# Patient Record
Sex: Female | Born: 1990 | Race: Black or African American | Hispanic: No | Marital: Single | Smoking: Current every day smoker
Health system: Southern US, Community
[De-identification: ages and names within clinical notes are randomized; demographics above are authoritative.]

## PROBLEM LIST (undated history)

## (undated) DIAGNOSIS — R011 Cardiac murmur, unspecified: Secondary | ICD-10-CM

## (undated) DIAGNOSIS — D649 Anemia, unspecified: Secondary | ICD-10-CM

## (undated) HISTORY — PX: TONSILLECTOMY: SUR1361

---
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
## 2008-06-03 ENCOUNTER — Emergency Department: Payer: Self-pay | Admitting: Internal Medicine

## 2008-12-15 ENCOUNTER — Emergency Department: Payer: Self-pay | Admitting: Emergency Medicine

## 2008-12-17 ENCOUNTER — Emergency Department: Payer: Self-pay | Admitting: Emergency Medicine

## 2009-05-04 ENCOUNTER — Ambulatory Visit: Payer: Self-pay | Admitting: Internal Medicine

## 2009-05-24 ENCOUNTER — Inpatient Hospital Stay: Payer: Self-pay | Admitting: Internal Medicine

## 2009-05-27 ENCOUNTER — Ambulatory Visit: Payer: Self-pay | Admitting: Internal Medicine

## 2009-06-03 ENCOUNTER — Ambulatory Visit: Payer: Self-pay | Admitting: Internal Medicine

## 2009-07-19 ENCOUNTER — Emergency Department: Payer: Self-pay | Admitting: Emergency Medicine

## 2010-01-19 ENCOUNTER — Emergency Department: Payer: Self-pay | Admitting: Emergency Medicine

## 2011-06-06 ENCOUNTER — Emergency Department: Payer: Self-pay | Admitting: *Deleted

## 2011-08-15 ENCOUNTER — Emergency Department: Payer: Self-pay | Admitting: Unknown Physician Specialty

## 2012-09-14 ENCOUNTER — Emergency Department: Payer: Self-pay | Admitting: Internal Medicine

## 2012-09-21 ENCOUNTER — Emergency Department: Payer: Self-pay | Admitting: Emergency Medicine

## 2012-09-22 ENCOUNTER — Emergency Department: Payer: Self-pay | Admitting: Unknown Physician Specialty

## 2013-10-02 ENCOUNTER — Emergency Department: Payer: Self-pay | Admitting: Emergency Medicine

## 2013-10-02 LAB — URINALYSIS, COMPLETE
BACTERIA: NONE SEEN
Bilirubin,UR: NEGATIVE
Blood: NEGATIVE
Glucose,UR: NEGATIVE mg/dL (ref 0–75)
Ketone: NEGATIVE
NITRITE: NEGATIVE
PROTEIN: NEGATIVE
Ph: 5 (ref 4.5–8.0)
Specific Gravity: 1.021 (ref 1.003–1.030)
WBC UR: 5 /HPF (ref 0–5)

## 2013-10-02 LAB — GC/CHLAMYDIA PROBE AMP

## 2013-10-02 LAB — WET PREP, GENITAL

## 2013-10-02 LAB — HCG, QUANTITATIVE, PREGNANCY

## 2015-05-08 ENCOUNTER — Emergency Department
Admission: EM | Admit: 2015-05-08 | Discharge: 2015-05-08 | Disposition: A | Discharge status: Home / Self Care | Payer: Self-pay | Attending: Emergency Medicine | Admitting: Emergency Medicine

## 2015-05-08 ENCOUNTER — Encounter: Payer: Self-pay | Admitting: Emergency Medicine

## 2015-05-08 DIAGNOSIS — L02411 Cutaneous abscess of right axilla: Secondary | ICD-10-CM | POA: Insufficient documentation

## 2015-05-08 DIAGNOSIS — L723 Sebaceous cyst: Secondary | ICD-10-CM | POA: Insufficient documentation

## 2015-05-08 DIAGNOSIS — L089 Local infection of the skin and subcutaneous tissue, unspecified: Secondary | ICD-10-CM

## 2015-05-08 DIAGNOSIS — Z720 Tobacco use: Secondary | ICD-10-CM | POA: Insufficient documentation

## 2015-05-08 HISTORY — DX: Anemia, unspecified: D64.9

## 2015-05-08 HISTORY — DX: Cardiac murmur, unspecified: R01.1

## 2015-05-08 MED ORDER — TRAMADOL HCL 50 MG PO TABS: 50.0000 mg | ORAL_TABLET | Freq: Two times a day (BID) | ORAL | Status: DC

## 2015-05-08 MED ORDER — LIDOCAINE-EPINEPHRINE (PF) 1 %-1:200000 IJ SOLN
30.0000 mL | Freq: Once | INTRAMUSCULAR | Status: AC
  Administered 2015-05-08: 30 mL via INTRADERMAL
  Filled 2015-05-08: qty 30

## 2015-05-08 MED ORDER — SULFAMETHOXAZOLE-TRIMETHOPRIM 800-160 MG PO TABS: 2.0000 | ORAL_TABLET | Freq: Two times a day (BID) | ORAL | Status: DC

## 2015-05-08 NOTE — ED Notes (Signed)
Noticed a large raised area under right arm about 3 days ago.. Area is tender but not red  No drainage noted

## 2015-05-08 NOTE — ED Provider Notes (Signed)
[facility] Emergency Department Provider Note ?____________________________________________ ? Time seen: 1212 ? I have reviewed the triage vital signs and the nursing notes. ________ HISTORY ? Chief Complaint Lymphadenopathy  HPI  Courtney Gordon is a 24 y.o. female reports to the ED for evaluation of a tender raised area to the right armpit for about 3 days. She describes putting warm compresses on the area to help and that seems to have caused increased swelling there. She denies any spontaneous drainage, fever, chills, or sweats. She denies any prior history of abscesses or boils.    Past Medical History  Diagnosis Date  . Heart murmur   . Anemia    There are no active problems to display for this patient. ? Past Surgical History  Procedure Laterality Date  . Tonsillectomy     Current Outpatient Rx  Name  Route  Sig  Dispense  Refill  . sulfamethoxazole-trimethoprim (BACTRIM DS,SEPTRA DS) 800-160 MG per tablet   Oral   Take 2 tablets by mouth 2 (two) times daily.   40 tablet   0   . traMADol (ULTRAM) 50 MG tablet   Oral   Take 1 tablet (50 mg total) by mouth 2 (two) times daily.   10 tablet   0   ? Allergies Review of patient's allergies indicates no known allergies. ? No family history on file. ? Social History Social History  Substance Use Topics  . Smoking status: Current Every Day Smoker    Types: Cigarettes  . Smokeless tobacco: None  . Alcohol Use: No    Review of Systems Constitutional: Negative for fever. HEENT:  Normocephalic/atraumatic. Negative for visual/hearingchanges, sore throat, or nasal congestion. Cardiovascular: Negative for chest pain. Respiratory: Negative for shortness of breath. Musculoskeletal: Negative for back pain. Skin: Tender area to right axilla as above. Neurological: Negative for headaches, focal weakness or numbness. Hematological/Lymphatic:Negative for enlarged lymph nodes  10-point ROS  otherwise negative. ____________________________________________  PHYSICAL EXAM:  VITAL SIGNS: ED Triage Vitals  Enc Vitals Group     BP 05/08/15 1127 117/72 mmHg     Pulse Rate 05/08/15 1127 85     Resp 05/08/15 1127 16     Temp 05/08/15 1127 98.1 F (36.7 C)     Temp Source 05/08/15 1127 Oral     SpO2 05/08/15 1127 100 %     Weight 05/08/15 1127 130 lb (58.968 kg)     Height 05/08/15 1127 5\' 5"  (1.651 m)     Head Cir --      Peak Flow --      Pain Score 05/08/15 1128 8     Pain Loc --      Pain Edu? --      Excl. in [place]? --    Constitutional: Alert and oriented. Well appearing and in no distress. HEENT: Normocephalic and atraumatic.Conjunctivae are normal. PERRL. Normal extraocular movements. Mucous membranes are moist. Hematological/Lymphatic/Immunological: No cervical lymphadenopathy. Cardiovascular: Normal rate, regular rhythm.No murmurs, rubs, or gallops.  Respiratory: Normal respiratory effort. Breath sounds are clear and equal bilaterally. No wheezes/rales/rhonchi. Gastrointestinal: Soft and non tender. No distention.  Genitourinary: deferred Musculoskeletal: Nontender with normal range of motion in all extremities. No joint effusions.  No lower extremity tenderness nor edema. Neurologic:  Normal speech and language. No gross focal neurologic deficits are appreciated.  Skin:  Right axilla with firm, tender, cystic formation.  Psychiatric: Mood and affect are normal. Speech and behavior are normal. Patient exhibits appropriate insight and judgment. _______________________ INCISION  AND DRAINAGE  Performed by: Melvenia Needles Consent: Verbal consent obtained. Risks and benefits: risks, benefits and alternatives were discussed Type: abscess  Body area: right axilla  Anesthesia: local infiltration  Incision was made with a scalpel.  Local anesthetic: lidocaine 1% w/epinephrine  Anesthetic total: 8 ml  Complexity: complex Blunt dissection to break up  loculations  Drainage: purulent & sebaceous   Drainage amount: 2 ml   Packing material: 1/4 in iodoform gauze  Patient tolerance: Patient tolerated the procedure well with no immediate complications. ______________________________________________________ INITIAL IMPRESSION / ASSESSMENT AND PLAN / ED COURSE ? Acutely infected sebaceous cyst s/p I & D. Wound packing instilled. Patient given instructions for wound care and shaving hygiene. Return to the ED in 3 days for wound check. Bactrim DS and Ultram #10 provided.  ____________________________________________ FINAL CLINICAL IMPRESSION(S) / ED DIAGNOSES?  Final diagnoses:  Infected sebaceous cyst  Abscess of axilla, right      Melvenia Needles, PA-C 05/08/15 1334  Daymon Larsen, MD 05/08/15 1430

## 2015-05-08 NOTE — Discharge Instructions (Signed)
Abscess An abscess (boil or furuncle) is an infected area on or under the skin. This area is filled with yellowish-white fluid (pus) and other material (debris). HOME CARE   Only take medicines as told by your doctor.  If you were given antibiotic medicine, take it as directed. Finish the medicine even if you start to feel better.  If gauze is used, follow your doctor's directions for changing the gauze.  To avoid spreading the infection:  Keep your abscess covered with a bandage.  Wash your hands well.  Do not share personal care items, towels, or whirlpools with others.  Avoid skin contact with others.  Keep your skin and clothes clean around the abscess.  Keep all doctor visits as told. GET HELP RIGHT AWAY IF:   You have more pain, puffiness (swelling), or redness in the wound site.  You have more fluid or blood coming from the wound site.  You have muscle aches, chills, or you feel sick.  You have a fever. MAKE SURE YOU:   Understand these instructions.  Will watch your condition.  Will get help right away if you are not doing well or get worse. Document Released: 02/06/2008 Document Revised: 02/19/2012 Document Reviewed: 11/02/2011 [facility] Patient Information 2015 [place], [place]. This information is not intended to replace advice given to you by your health care provider. Make sure you discuss any questions you have with your health care provider.  Incision and Drainage Incision and drainage is a procedure in which a sac-like structure (cystic structure) is opened and drained. The area to be drained usually contains material such as pus, fluid, or blood.  LET YOUR CAREGIVER KNOW ABOUT:   Allergies to medicine.  Medicines taken, including vitamins, herbs, eyedrops, over-the-counter medicines, and creams.  Use of steroids (by mouth or creams).  Previous problems with anesthetics or numbing medicines.  History of bleeding problems or blood clots.  Previous  surgery.  Other health problems, including diabetes and kidney problems.  Possibility of pregnancy, if this applies. RISKS AND COMPLICATIONS  Pain.  Bleeding.  Scarring.  Infection. BEFORE THE PROCEDURE  You may need to have an ultrasound or other imaging tests to see how large or deep your cystic structure is. Blood tests may also be used to determine if you have an infection or how severe the infection is. You may need to have a tetanus shot. PROCEDURE  The affected area is cleaned with a cleaning fluid. The cyst area will then be numbed with a medicine (local anesthetic). A small incision will be made in the cystic structure. A syringe or catheter may be used to drain the contents of the cystic structure, or the contents may be squeezed out. The area will then be flushed with a cleansing solution. After cleansing the area, it is often gently packed with a gauze or another wound dressing. Once it is packed, it will be covered with gauze and tape or some other type of wound dressing. AFTER THE PROCEDURE   Often, you will be allowed to go home right after the procedure.  You may be given antibiotic medicine to prevent or heal an infection.  If the area was packed with gauze or some other wound dressing, you will likely need to come back in 1 to 2 days to get it removed.  The area should heal in about 14 days. Document Released: 02/13/2001 Document Revised: 02/19/2012 Document Reviewed: 10/15/2011 [facility] Patient Information 2015 [place], [place]. This information is not intended to replace advice  given to you by your health care provider. Make sure you discuss any questions you have with your health care provider.  Keep the wound clean, dry, and covered. Return in 3 days for wound check. Take the antibiotic as directed.

## 2015-05-08 NOTE — ED Notes (Signed)
Pt reports lump under in right axillary area. Area is warm to touch, pt reports its been there for 3 days.

## 2015-05-11 ENCOUNTER — Emergency Department
Admission: EM | Admit: 2015-05-11 | Discharge: 2015-05-11 | Disposition: A | Discharge status: Home / Self Care | Payer: Self-pay | Attending: Emergency Medicine | Admitting: Emergency Medicine

## 2015-05-11 DIAGNOSIS — Z720 Tobacco use: Secondary | ICD-10-CM | POA: Insufficient documentation

## 2015-05-11 DIAGNOSIS — Z79899 Other long term (current) drug therapy: Secondary | ICD-10-CM | POA: Insufficient documentation

## 2015-05-11 DIAGNOSIS — Z4801 Encounter for change or removal of surgical wound dressing: Secondary | ICD-10-CM | POA: Insufficient documentation

## 2015-05-11 DIAGNOSIS — Z5189 Encounter for other specified aftercare: Secondary | ICD-10-CM

## 2015-05-11 NOTE — ED Notes (Signed)
Had abscess drained Sunday and here to having packing removed.

## 2015-05-11 NOTE — ED Provider Notes (Signed)
[facility] Emergency Department Provider Note ____________________________________________  Time seen: Approximately 10:19 PM  I have reviewed the triage vital signs and the nursing notes.   HISTORY  Chief Complaint Abscess   HPI Courtney Gordon is a 24 y.o. female who presents to the emergency department for packing removal. She was here 3 days ago and had an I&D of the right axilla. She reports the area is much smaller but remains tender. She is taking her antibiotics as prescribed.   Past Medical History  Diagnosis Date  . Heart murmur   . Anemia     There are no active problems to display for this patient.   Past Surgical History  Procedure Laterality Date  . Tonsillectomy      Current Outpatient Rx  Name  Route  Sig  Dispense  Refill  . sulfamethoxazole-trimethoprim (BACTRIM DS,SEPTRA DS) 800-160 MG per tablet   Oral   Take 2 tablets by mouth 2 (two) times daily.   40 tablet   0   . traMADol (ULTRAM) 50 MG tablet   Oral   Take 1 tablet (50 mg total) by mouth 2 (two) times daily.   10 tablet   0     Allergies Review of patient's allergies indicates no known allergies.  No family history on file.  Social History Social History  Substance Use Topics  . Smoking status: Current Every Day Smoker    Types: Cigarettes  . Smokeless tobacco: Not on file  . Alcohol Use: No    Review of Systems Constitutional: No fever/chills Eyes: No visual changes. ENT: No sore throat. Cardiovascular: Denies chest pain. Respiratory: Denies shortness of breath. Gastrointestinal: No abdominal pain.  No nausea, no vomiting.  Musculoskeletal: Negative for back pain. Skin: Packing of abscess right axilla. Neurological: Negative for headaches, focal weakness or numbness. 10-point ROS otherwise negative.  ____________________________________________   PHYSICAL EXAM:  VITAL SIGNS: ED Triage Vitals  Enc Vitals Group     BP 05/11/15 2039  91/63 mmHg     Pulse Rate 05/11/15 2039 81     Resp 05/11/15 2039 18     Temp 05/11/15 2039 98.5 F (36.9 C)     Temp Source 05/11/15 2039 Oral     SpO2 05/11/15 2039 98 %     Weight 05/11/15 2039 135 lb (61.236 kg)     Height 05/11/15 2039 5\' 5"  (1.651 m)     Head Cir --      Peak Flow --      Pain Score 05/11/15 2040 7     Pain Loc --      Pain Edu? --      Excl. in [place]? --     Constitutional: Alert and oriented. Well appearing and in no acute distress. Eyes: Conjunctivae are normal. PERRL. EOMI. Head: Atraumatic. Nose: No congestion/rhinnorhea. Mouth/Throat: Mucous membranes are moist.  Neck: No stridor. Cardiovascular:Good peripheral circulation. Respiratory: Normal respiratory effort.  No retractions.  Musculoskeletal: Full ROM throughout. Neurologic:  Normal speech and language. No gross focal neurologic deficits are appreciated. Speech is normal. No gait instability. Skin: Skin drainage noted on the dressing. Site of I&D appears to be resolving.   Psychiatric: Mood and affect are normal. Speech and behavior are normal.  ____________________________________________   LABS (all labs ordered are listed, but only abnormal results are displayed)  Labs Reviewed - No data to display ____________________________________________  RADIOLOGY  Not indicated ____________________________________________   PROCEDURES  Procedure(s) performed: Packing removed from right  axilla  ___________________________________________   INITIAL IMPRESSION / ASSESSMENT AND PLAN / ED COURSE  Pertinent labs & imaging results that were available during my care of the patient were reviewed by me and considered in my medical decision making (see chart for details).  Patient was advised to follow up with the primary care provider for symptoms of concern or return to the emergency department if unable to schedule an appointment. ____________________________________________   FINAL CLINICAL  IMPRESSION(S) / ED DIAGNOSES  Final diagnoses:  Wound check, abscess          Victorino Dike, FNP 05/11/15 2222  Harvest Dark, MD 05/11/15 2227

## 2015-08-07 ENCOUNTER — Encounter: Payer: Self-pay | Admitting: Emergency Medicine

## 2015-08-07 ENCOUNTER — Emergency Department
Admission: EM | Admit: 2015-08-07 | Discharge: 2015-08-07 | Disposition: A | Discharge status: Home / Self Care | Payer: Self-pay | Attending: Emergency Medicine | Admitting: Emergency Medicine

## 2015-08-07 DIAGNOSIS — F17210 Nicotine dependence, cigarettes, uncomplicated: Secondary | ICD-10-CM | POA: Insufficient documentation

## 2015-08-07 DIAGNOSIS — Z3201 Encounter for pregnancy test, result positive: Secondary | ICD-10-CM | POA: Insufficient documentation

## 2015-08-07 DIAGNOSIS — O99331 Smoking (tobacco) complicating pregnancy, first trimester: Secondary | ICD-10-CM | POA: Insufficient documentation

## 2015-08-07 DIAGNOSIS — Z3A01 Less than 8 weeks gestation of pregnancy: Secondary | ICD-10-CM | POA: Insufficient documentation

## 2015-08-07 DIAGNOSIS — Z792 Long term (current) use of antibiotics: Secondary | ICD-10-CM | POA: Insufficient documentation

## 2015-08-07 DIAGNOSIS — Z79899 Other long term (current) drug therapy: Secondary | ICD-10-CM | POA: Insufficient documentation

## 2015-08-07 DIAGNOSIS — O210 Mild hyperemesis gravidarum: Secondary | ICD-10-CM | POA: Insufficient documentation

## 2015-08-07 LAB — POCT PREGNANCY, URINE: Preg Test, Ur: POSITIVE — AB

## 2015-08-07 NOTE — ED Provider Notes (Signed)
[facility] Emergency Department Provider Note  ____________________________________________  Time seen: Approximately 1:10 PM  I have reviewed the triage vital signs and the nursing notes.   HISTORY  Chief Complaint Possible Pregnancy    HPI Courtney Gordon is a 24 y.o. female who presents to the emergency department complaining of morning nausea and vomiting. She states that she "feels like I am pregnant." She states that she has been using home pregnancy tests that have returned either as invalid or negative. She states that her last period was October 2. Patient denies any vaginal discharge or bleeding. Patient denies any abdominal pain. No other symptoms or complaints.   Past Medical History  Diagnosis Date  . Heart murmur   . Anemia     There are no active problems to display for this patient.   Past Surgical History  Procedure Laterality Date  . Tonsillectomy      Current Outpatient Rx  Name  Route  Sig  Dispense  Refill  . sulfamethoxazole-trimethoprim (BACTRIM DS,SEPTRA DS) 800-160 MG per tablet   Oral   Take 2 tablets by mouth 2 (two) times daily.   40 tablet   0   . traMADol (ULTRAM) 50 MG tablet   Oral   Take 1 tablet (50 mg total) by mouth 2 (two) times daily.   10 tablet   0     Allergies Review of patient's allergies indicates no known allergies.  History reviewed. No pertinent family history.  Social History Social History  Substance Use Topics  . Smoking status: Current Every Day Smoker    Types: Cigarettes  . Smokeless tobacco: None  . Alcohol Use: No    Review of Systems Constitutional: No fever/chills Eyes: No visual changes. ENT: No sore throat. Cardiovascular: Denies chest pain. Respiratory: Denies shortness of breath. Gastrointestinal: No abdominal pain.  Endorses morning nausea and vomiting.  No diarrhea.  No constipation. Genitourinary: Negative for dysuria. Musculoskeletal: Negative for back  pain. Skin: Negative for rash. Neurological: Negative for headaches, focal weakness or numbness.  10-point ROS otherwise negative.  ____________________________________________   PHYSICAL EXAM:  VITAL SIGNS: ED Triage Vitals  Enc Vitals Group     BP 08/07/15 1223 99/51 mmHg     Pulse Rate 08/07/15 1223 72     Resp 08/07/15 1223 18     Temp 08/07/15 1223 98.8 F (37.1 C)     Temp Source 08/07/15 1223 Oral     SpO2 08/07/15 1223 98 %     Weight 08/07/15 1223 127 lb (57.607 kg)     Height 08/07/15 1223 5\' 6"  (1.676 m)     Head Cir --      Peak Flow --      Pain Score --      Pain Loc --      Pain Edu? --      Excl. in [place]? --     Constitutional: Alert and oriented. Well appearing and in no acute distress. Eyes: Conjunctivae are normal. PERRL. EOMI. Head: Atraumatic. Nose: No congestion/rhinnorhea. Mouth/Throat: Mucous membranes are moist.  Oropharynx non-erythematous. Neck: No stridor.   Cardiovascular: Normal rate, regular rhythm. Grossly normal heart sounds.  Good peripheral circulation. Respiratory: Normal respiratory effort.  No retractions. Lungs CTAB. Gastrointestinal: Soft and nontender. No distention. No abdominal bruits. No CVA tenderness. Musculoskeletal: No lower extremity tenderness nor edema.  No joint effusions. Neurologic:  Normal speech and language. No gross focal neurologic deficits are appreciated. No gait instability. Skin:  Skin is warm, dry and intact. No rash noted. Psychiatric: Mood and affect are normal. Speech and behavior are normal.  ____________________________________________   LABS (all labs ordered are listed, but only abnormal results are displayed)  Labs Reviewed  POCT PREGNANCY, URINE - Abnormal; Notable for the following:    Preg Test, Ur POSITIVE (*)    All other components within normal limits  POC URINE PREG, ED    ____________________________________________  EKG   ____________________________________________  RADIOLOGY   ____________________________________________   PROCEDURES  Procedure(s) performed: None  Critical Care performed: No  ____________________________________________   INITIAL IMPRESSION / ASSESSMENT AND PLAN / ED COURSE  Pertinent labs & imaging results that were available during my care of the patient were reviewed by me and considered in my medical decision making (see chart for details).  Patient's history, symptoms, physical exam are taken and the consideration for diagnosis. I advised patient of findings and diagnosis and the patient verbalizes understanding of same. The patient is pregnant by pregnancy test here in the emergency department. Patient is to take over-the-counter prenatal vitamins and scheduled OB/GYN appointment. I explained the treatment plan to the patient and the patient verbalizes understanding and compliance with same. Patient is to follow-up with primary care provider or specialist provided on paperwork for further evaluation and treatment should symptoms persist past treatment course. ED precautions are given to return for worsening of symptoms.  All of the patient's questions are answered. ____________________________________________   FINAL CLINICAL IMPRESSION(S) / ED DIAGNOSES  Final diagnoses:  Encounter for pregnancy test, result positive      Darletta Moll, PA-C 08/07/15 1326  Ahmed Prima, MD 08/07/15 [id number]

## 2015-08-07 NOTE — Discharge Instructions (Signed)
Prenatal Care °WHAT IS PRENATAL CARE?  °Prenatal care is the process of caring for a pregnant woman before she gives birth. Prenatal care makes sure that she and her baby remain as healthy as possible throughout pregnancy. Prenatal care may be provided by a midwife, family practice health care provider, or a childbirth and pregnancy specialist (obstetrician). Prenatal care may include physical examinations, testing, treatments, and education on nutrition, lifestyle, and social support services. °WHY IS PRENATAL CARE SO IMPORTANT?  °Early and consistent prenatal care increases the chance that you and your baby will remain healthy throughout your pregnancy. This type of care also decreases a baby's risk of being born too early (prematurely), or being born smaller than expected (small for gestational age). Any underlying medical conditions you may have that could pose a risk during your pregnancy are discussed during prenatal care visits. You will also be monitored regularly for any new conditions that may arise during your pregnancy so they can be treated quickly and effectively. °WHAT HAPPENS DURING PRENATAL CARE VISITS? °Prenatal care visits may include the following: °Discussion °Tell your health care provider about any new signs or symptoms you have experienced since your last visit. These might include: °· Nausea or vomiting. °· Increased or decreased level of energy. °· Difficulty sleeping. °· Back or leg pain. °· Weight changes. °· Frequent urination. °· Shortness of breath with physical activity. °· Changes in your skin, such as the development of a rash or itchiness. °· Vaginal discharge or bleeding. °· Feelings of excitement or nervousness. °· Changes in your baby's movements. °You may want to write down any questions or topics you want to discuss with your health care provider and bring them with you to your appointment. °Examination °During your first prenatal care visit, you will likely have a complete  physical exam. Your health care provider will often examine your vagina, cervix, and the position of your uterus, as well as check your heart, lungs, and other body systems. As your pregnancy progresses, your health care provider will measure the size of your uterus and your baby's position inside your uterus. He or she may also examine you for early signs of labor. Your prenatal visits may also include checking your blood pressure and, after about 10-12 weeks of pregnancy, listening to your baby's heartbeat. °Testing °Regular testing often includes: °· Urinalysis. This checks your urine for glucose, protein, or signs of infection. °· Blood count. This checks the levels of white and red blood cells in your body. °· Tests for sexually transmitted infections (STIs). Testing for STIs at the beginning of pregnancy is routinely done and is required in many states. °· Antibody testing. You will be checked to see if you are immune to certain illnesses, such as rubella, that can affect a developing fetus. °· Glucose screen. Around 24-28 weeks of pregnancy, your blood glucose level will be checked for signs of gestational diabetes. Follow-up tests may be recommended. °· Group B strep. This is a bacteria that is commonly found inside a woman's vagina. This test will inform your health care provider if you need an antibiotic to reduce the amount of this bacteria in your body prior to labor and childbirth. °· Ultrasound. Many pregnant women undergo an ultrasound screening around 18-20 weeks of pregnancy to evaluate the health of the fetus and check for any developmental abnormalities. °· HIV (human immunodeficiency virus) testing. Early in your pregnancy, you will be screened for HIV. If you are at high risk for HIV, this test   may be repeated during your third trimester of pregnancy. You may be offered other testing based on your age, personal or family medical history, or other factors.  HOW OFTEN SHOULD I PLAN TO SEE MY  HEALTH CARE PROVIDER FOR PRENATAL CARE? Your prenatal care check-up schedule depends on any medical conditions you have before, or develop during, your pregnancy. If you do not have any underlying medical conditions, you will likely be seen for checkups:  Monthly, during the first 6 months of pregnancy.  Twice a month during months 7 and 8 of pregnancy.  Weekly starting in the 9th month of pregnancy and until delivery. If you develop signs of early labor or other concerning signs or symptoms, you may need to see your health care provider more often. Ask your health care provider what prenatal care schedule is best for you. WHAT CAN I DO TO KEEP MYSELF AND MY BABY AS HEALTHY AS POSSIBLE DURING MY PREGNANCY?  Take a prenatal vitamin containing 400 micrograms (0.4 mg) of folic acid every day. Your health care provider may also ask you to take additional vitamins such as iodine, vitamin D, iron, copper, and zinc.  Take 1500-2000 mg of calcium daily starting at your 20th week of pregnancy until you deliver your baby.  Make sure you are up to date on your vaccinations. Unless directed otherwise by your health care provider:  You should receive a tetanus, diphtheria, and pertussis (Tdap) vaccination between the 27th and 36th week of your pregnancy, regardless of when your last Tdap immunization occurred. This helps protect your baby from whooping cough (pertussis) after he or she is born.  You should receive an annual inactivated influenza vaccine (IIV) to help protect you and your baby from influenza. This can be done at any point during your pregnancy.  Eat a well-rounded diet that includes:  Fresh fruits and vegetables.  Lean proteins.  Calcium-rich foods such as milk, yogurt, hard cheeses, and dark, leafy greens.  Whole grain breads.  Do noteat seafood high in mercury, including:  Swordfish.  Tilefish.  Shark.  King mackerel.  More than 6 oz tuna per week.  Do not eat:  Raw  or undercooked meats or eggs.  Unpasteurized foods, such as soft cheeses (brie, blue, or feta), juices, and milks.  Lunch meats.  Hot dogs that have not been heated until they are steaming.  Drink enough water to keep your urine clear or pale yellow. For many women, this may be 10 or more 8 oz glasses of water each day. Keeping yourself hydrated helps deliver nutrients to your baby and may prevent the start of pre-term uterine contractions.  Do not use any tobacco products including cigarettes, chewing tobacco, or electronic cigarettes. If you need help quitting, ask your health care provider.  Do not drink beverages containing alcohol. No safe level of alcohol consumption during pregnancy has been determined.  Do not use any illegal drugs. These can harm your developing baby or cause a miscarriage.  Ask your health care provider or pharmacist before taking any prescription or over-the-counter medicines, herbs, or supplements.  Limit your caffeine intake to no more than 200 mg per day.  Exercise. Unless told otherwise by your health care provider, try to get 30 minutes of moderate exercise most days of the week. Do not  do high-impact activities, contact sports, or activities with a high risk of falling, such as horseback riding or downhill skiing.  Get plenty of rest.  Avoid anything that raises your  body temperature, such as hot tubs and saunas. °· If you own a cat, do not empty its litter box. Bacteria contained in cat feces can cause an infection called toxoplasmosis. This can result in serious harm to the fetus. °· Stay away from chemicals such as insecticides, lead, mercury, and cleaning or paint products that contain solvents. °· Do not have any X-rays taken unless medically necessary. °· Take a childbirth and breastfeeding preparation class. Ask your health care provider if you need a referral or recommendation. °  °This information is not intended to replace advice given to you by  your health care provider. Make sure you discuss any questions you have with your health care provider. °  °Document Released: 08/23/2003 Document Revised: 09/10/2014 Document Reviewed: 11/04/2013 °Elsevier Interactive Patient Education ©2016 [employer] ° °

## 2015-08-07 NOTE — ED Notes (Signed)
States she has not had a period since Oct. Has taken 2 home preg test that were negative  But feels like she is preg. Denies any abd pain or vaginal bleeding

## 2015-08-07 NOTE — ED Notes (Signed)
Pt presents to ER for c/o of inconsistent menstrual cycle. Pt has not had cycle since November and is concerned if she is pregnant. States that she has taken 2 pregnancy tests that have been negative or invalid; would like confirmation. Pt has had nausea and vomiting urges everyday around 0500 .

## 2015-08-21 ENCOUNTER — Encounter: Payer: Self-pay | Admitting: Emergency Medicine

## 2015-08-21 ENCOUNTER — Emergency Department: Payer: Self-pay

## 2015-08-21 ENCOUNTER — Emergency Department
Admission: EM | Admit: 2015-08-21 | Discharge: 2015-08-21 | Disposition: A | Discharge status: Home / Self Care | Payer: Self-pay | Attending: Emergency Medicine | Admitting: Emergency Medicine

## 2015-08-21 DIAGNOSIS — Z87891 Personal history of nicotine dependence: Secondary | ICD-10-CM | POA: Insufficient documentation

## 2015-08-21 DIAGNOSIS — Z3A01 Less than 8 weeks gestation of pregnancy: Secondary | ICD-10-CM | POA: Insufficient documentation

## 2015-08-21 DIAGNOSIS — N760 Acute vaginitis: Secondary | ICD-10-CM

## 2015-08-21 DIAGNOSIS — O200 Threatened abortion: Secondary | ICD-10-CM

## 2015-08-21 DIAGNOSIS — N939 Abnormal uterine and vaginal bleeding, unspecified: Secondary | ICD-10-CM

## 2015-08-21 DIAGNOSIS — B9689 Other specified bacterial agents as the cause of diseases classified elsewhere: Secondary | ICD-10-CM

## 2015-08-21 DIAGNOSIS — O23591 Infection of other part of genital tract in pregnancy, first trimester: Secondary | ICD-10-CM | POA: Insufficient documentation

## 2015-08-21 DIAGNOSIS — O210 Mild hyperemesis gravidarum: Secondary | ICD-10-CM | POA: Insufficient documentation

## 2015-08-21 DIAGNOSIS — O98811 Other maternal infectious and parasitic diseases complicating pregnancy, first trimester: Secondary | ICD-10-CM | POA: Insufficient documentation

## 2015-08-21 DIAGNOSIS — A5901 Trichomonal vulvovaginitis: Secondary | ICD-10-CM

## 2015-08-21 LAB — WET PREP, GENITAL
Sperm: NEGATIVE
YEAST WET PREP: NEGATIVE — AB

## 2015-08-21 LAB — CBC WITH DIFFERENTIAL/PLATELET
BASOS PCT: 1 %
Basophils Absolute: 0.1 10*3/uL (ref 0–0.1)
EOS ABS: 0 10*3/uL (ref 0–0.7)
EOS PCT: 0 %
HCT: 34.9 % — ABNORMAL LOW (ref 35.0–47.0)
Hemoglobin: 11.1 g/dL — ABNORMAL LOW (ref 12.0–16.0)
LYMPHS ABS: 1.5 10*3/uL (ref 1.0–3.6)
Lymphocytes Relative: 15 %
MCH: 23.9 pg — AB (ref 26.0–34.0)
MCHC: 31.9 g/dL — AB (ref 32.0–36.0)
MCV: 75 fL — ABNORMAL LOW (ref 80.0–100.0)
MONOS PCT: 7 %
Monocytes Absolute: 0.8 10*3/uL (ref 0.2–0.9)
NEUTROS PCT: 77 %
Neutro Abs: 8 10*3/uL — ABNORMAL HIGH (ref 1.4–6.5)
PLATELETS: 248 10*3/uL (ref 150–440)
RBC: 4.66 MIL/uL (ref 3.80–5.20)
RDW: 19.4 % — ABNORMAL HIGH (ref 11.5–14.5)
WBC: 10.4 10*3/uL (ref 3.6–11.0)

## 2015-08-21 LAB — CHLAMYDIA/NGC RT PCR (ARMC ONLY)
CHLAMYDIA TR: NOT DETECTED
N GONORRHOEAE: NOT DETECTED

## 2015-08-21 LAB — HCG, QUANTITATIVE, PREGNANCY: hCG, Beta Chain, Quant, S: 46406 m[IU]/mL — ABNORMAL HIGH (ref <5)

## 2015-08-21 LAB — ABO/RH: ABO/RH(D): A POS

## 2015-08-21 MED ORDER — METRONIDAZOLE 500 MG PO TABS: 500.0000 mg | ORAL_TABLET | Freq: Two times a day (BID) | ORAL | Status: DC

## 2015-08-21 MED ORDER — METRONIDAZOLE 500 MG PO TABS
500.0000 mg | ORAL_TABLET | Freq: Once | ORAL | Status: AC
  Administered 2015-08-21: 500 mg via ORAL
  Filled 2015-08-21: qty 1

## 2015-08-21 NOTE — Discharge Instructions (Signed)
Bacterial Vaginosis Bacterial vaginosis is a vaginal infection that occurs when the normal balance of bacteria in the vagina is disrupted. It results from an overgrowth of certain bacteria. This is the most common vaginal infection in women of childbearing age. Treatment is important to prevent complications, especially in pregnant women, as it can cause a premature delivery. CAUSES  Bacterial vaginosis is caused by an increase in harmful bacteria that are normally present in smaller amounts in the vagina. Several different kinds of bacteria can cause bacterial vaginosis. However, the reason that the condition develops is not fully understood. RISK FACTORS Certain activities or behaviors can put you at an increased risk of developing bacterial vaginosis, including:  Having a new sex partner or multiple sex partners.  Douching.  Using an intrauterine device (IUD) for contraception. Women do not get bacterial vaginosis from toilet seats, bedding, swimming pools, or contact with objects around them. SIGNS AND SYMPTOMS  Some women with bacterial vaginosis have no signs or symptoms. Common symptoms include:  Grey vaginal discharge.  A fishlike odor with discharge, especially after sexual intercourse.  Itching or burning of the vagina and vulva.  Burning or pain with urination. DIAGNOSIS  Your health care provider will take a medical history and examine the vagina for signs of bacterial vaginosis. A sample of vaginal fluid may be taken. Your health care provider will look at this sample under a microscope to check for bacteria and abnormal cells. A vaginal pH test may also be done.  TREATMENT  Bacterial vaginosis may be treated with antibiotic medicines. These may be given in the form of a pill or a vaginal cream. A second round of antibiotics may be prescribed if the condition comes back after treatment. Because bacterial vaginosis increases your risk for sexually transmitted diseases, getting  treated can help reduce your risk for chlamydia, gonorrhea, HIV, and herpes. HOME CARE INSTRUCTIONS   Only take over-the-counter or prescription medicines as directed by your health care provider.  If antibiotic medicine was prescribed, take it as directed. Make sure you finish it even if you start to feel better.  Tell all sexual partners that you have a vaginal infection. They should see their health care provider and be treated if they have problems, such as a mild rash or itching.  During treatment, it is important that you follow these instructions:  Avoid sexual activity or use condoms correctly.  Do not douche.  Avoid alcohol as directed by your health care provider.  Avoid breastfeeding as directed by your health care provider. SEEK MEDICAL CARE IF:   Your symptoms are not improving after 3 days of treatment.  You have increased discharge or pain.  You have a fever. MAKE SURE YOU:   Understand these instructions.  Will watch your condition.  Will get help right away if you are not doing well or get worse. FOR MORE INFORMATION  Centers for Disease Control and Prevention, Division of STD Prevention: [employer] American Sexual Health Association (ASHA): www.ashastd.org    This information is not intended to replace advice given to you by your health care provider. Make sure you discuss any questions you have with your health care provider.   Document Released: 08/20/2005 Document Revised: 09/10/2014 Document Reviewed: 04/01/2013 Elsevier Interactive Patient Education 2016 [employer].  Threatened Miscarriage A threatened miscarriage occurs when you have vaginal bleeding during your first 20 weeks of pregnancy but the pregnancy has not ended. If you have vaginal bleeding during this time, your health care provider  will do tests to make sure you are still pregnant. If the tests show you are still pregnant and the developing baby (fetus) inside your womb (uterus) is  still growing, your condition is considered a threatened miscarriage. A threatened miscarriage does not mean your pregnancy will end, but it does increase the risk of losing your pregnancy (complete miscarriage). CAUSES  The cause of a threatened miscarriage is usually not known. If you go on to have a complete miscarriage, the most common cause is an abnormal number of chromosomes in the developing baby. Chromosomes are the structures inside cells that hold all your genetic material. Some causes of vaginal bleeding that do not result in miscarriage include:  Having sex.  Having an infection.  Normal hormone changes of pregnancy.  Bleeding that occurs when an egg implants in your uterus. RISK FACTORS Risk factors for bleeding in early pregnancy include:  Obesity.  Smoking.  Drinking excessive amounts of alcohol or caffeine.  Recreational drug use. SIGNS AND SYMPTOMS  Light vaginal bleeding.  Mild abdominal pain or cramps. DIAGNOSIS  If you have bleeding with or without abdominal pain before 20 weeks of pregnancy, your health care provider will do tests to check whether you are still pregnant. One important test involves using sound waves and a computer (ultrasound) to create images of the inside of your uterus. Other tests include an internal exam of your vagina and uterus (pelvic exam) and measurement of your baby's heart rate.  You may be diagnosed with a threatened miscarriage if:  Ultrasound testing shows you are still pregnant.  Your baby's heart rate is strong.  A pelvic exam shows that the opening between your uterus and your vagina (cervix) is closed.  Your heart rate and blood pressure are stable.  Blood tests confirm you are still pregnant. TREATMENT  No treatments have been shown to prevent a threatened miscarriage from going on to a complete miscarriage. However, the right home care is important.  HOME CARE INSTRUCTIONS   Make sure you keep all your  appointments for prenatal care. This is very important.  Get plenty of rest.  Do not have sex or use tampons if you have vaginal bleeding.  Do not douche.  Do not smoke or use recreational drugs.  Do not drink alcohol.  Avoid caffeine. SEEK MEDICAL CARE IF:  You have light vaginal bleeding or spotting while pregnant.  You have abdominal pain or cramping.  You have a fever. SEEK IMMEDIATE MEDICAL CARE IF:  You have heavy vaginal bleeding.  You have blood clots coming from your vagina.  You have severe low back pain or abdominal cramps.  You have fever, chills, and severe abdominal pain. MAKE SURE YOU:  Understand these instructions.  Will watch your condition.  Will get help right away if you are not doing well or get worse.   This information is not intended to replace advice given to you by your health care provider. Make sure you discuss any questions you have with your health care provider.   Document Released: 08/20/2005 Document Revised: 08/25/2013 Document Reviewed: 06/16/2013 Elsevier Interactive Patient Education 2016 [employer].  Trichomoniasis Trichomoniasis is an infection caused by an organism called Trichomonas. The infection can affect both women and men. In women, the outer female genitalia and the vagina are affected. In men, the penis is mainly affected, but the prostate and other reproductive organs can also be involved. Trichomoniasis is a sexually transmitted infection (STI) and is most often passed to another  person through sexual contact.  RISK FACTORS  Having unprotected sexual intercourse.  Having sexual intercourse with an infected partner. SIGNS AND SYMPTOMS  Symptoms of trichomoniasis in women include:  Abnormal gray-green frothy vaginal discharge.  Itching and irritation of the vagina.  Itching and irritation of the area outside the vagina. Symptoms of trichomoniasis in men include:   Penile discharge with or without  pain.  Pain during urination. This results from inflammation of the urethra. DIAGNOSIS  Trichomoniasis may be found during a Pap test or physical exam. Your health care provider may use one of the following methods to help diagnose this infection:  Testing the pH of the vagina with a test tape.  Using a vaginal swab test that checks for the Trichomonas organism. A test is available that provides results within a few minutes.  Examining a urine sample.  Testing vaginal secretions. Your health care provider may test you for other STIs, including HIV. TREATMENT   You may be given medicine to fight the infection. Women should inform their health care provider if they could be or are pregnant. Some medicines used to treat the infection should not be taken during pregnancy.  Your health care provider may recommend over-the-counter medicines or creams to decrease itching or irritation.  Your sexual partner will need to be treated if infected.  Your health care provider may test you for infection again 3 months after treatment. HOME CARE INSTRUCTIONS   Take medicines only as directed by your health care provider.  Take over-the-counter medicine for itching or irritation as directed by your health care provider.  Do not have sexual intercourse while you have the infection.  Women should not douche or wear tampons while they have the infection.  Discuss your infection with your partner. Your partner may have gotten the infection from you, or you may have gotten it from your partner.  Have your sex partner get examined and treated if necessary.  Practice safe, informed, and protected sex.  See your health care provider for other STI testing. SEEK MEDICAL CARE IF:   You still have symptoms after you finish your medicine.  You develop abdominal pain.  You have pain when you urinate.  You have bleeding after sexual intercourse.  You develop a rash.  Your medicine makes you sick  or makes you throw up (vomit). MAKE SURE YOU:  Understand these instructions.  Will watch your condition.  Will get help right away if you are not doing well or get worse.   This information is not intended to replace advice given to you by your health care provider. Make sure you discuss any questions you have with your health care provider.   Document Released: 02/13/2001 Document Revised: 09/10/2014 Document Reviewed: 06/01/2013 Elsevier Interactive Patient Education [employer].

## 2015-08-21 NOTE — ED Provider Notes (Signed)
[facility] Emergency Department Provider Note  ____________________________________________  Time seen: Approximately 410 AM  I have reviewed the triage vital signs and the nursing notes.   HISTORY  Chief Complaint Vaginal Bleeding    HPI Courtney Gordon is a 24 y.o. female who comes into the hospital today with vaginal bleeding. The patient reports that it started around 10 PM. The patient is [redacted] weeks pregnant and has had some lower abdominal discomfort. She rates the pain as a 3-4 out of 10 in intensity. She reports the bleeding was so much that it scared her so she decided to come in. The agent reports that the bleeding has stopped since she came in initially. The patient is a G2 P1 001. She has had some nausea and vomiting with no lightheadedness or syncope. The patient has had cramping lower abdominal pain. She reports that she put on a pad when she started bleeding but has not needed more than one pad.   Past Medical History  Diagnosis Date  . Heart murmur   . Anemia     There are no active problems to display for this patient.   Past Surgical History  Procedure Laterality Date  . Tonsillectomy      Current Outpatient Rx  Name  Route  Sig  Dispense  Refill  . sulfamethoxazole-trimethoprim (BACTRIM DS,SEPTRA DS) 800-160 MG per tablet   Oral   Take 2 tablets by mouth 2 (two) times daily.   40 tablet   0   . traMADol (ULTRAM) 50 MG tablet   Oral   Take 1 tablet (50 mg total) by mouth 2 (two) times daily.   10 tablet   0     Allergies Review of patient's allergies indicates no known allergies.  History reviewed. No pertinent family history.  Social History Social History  Substance Use Topics  . Smoking status: Former Smoker    Types: Cigarettes  . Smokeless tobacco: Never Used  . Alcohol Use: No    Review of Systems Constitutional: No fever/chills Eyes: No visual changes. ENT: No sore throat. Cardiovascular: Denies chest  pain. Respiratory: Denies shortness of breath. Gastrointestinal: Mild abdominal pain with nausea, and vomiting.  No diarrhea.  No constipation. Genitourinary: Vaginal bleeding Musculoskeletal: Negative for back pain. Skin: Negative for rash. Neurological: Negative for headaches, focal weakness or numbness.  10-point ROS otherwise negative.  ____________________________________________   PHYSICAL EXAM:  VITAL SIGNS: ED Triage Vitals  Enc Vitals Group     BP 08/21/15 0011 106/60 mmHg     Pulse Rate 08/21/15 0011 88     Resp 08/21/15 0011 16     Temp 08/21/15 0011 98.7 F (37.1 C)     Temp Source 08/21/15 0011 Oral     SpO2 08/21/15 0011 98 %     Weight 08/21/15 0011 120 lb (54.432 kg)     Height 08/21/15 0011 5\' 5"  (1.651 m)     Head Cir --      Peak Flow --      Pain Score 08/21/15 0011 6     Pain Loc --      Pain Edu? --      Excl. in [place]? --     Constitutional: Alert and oriented. Well appearing and in no acute distress. Eyes: Conjunctivae are normal. PERRL. EOMI. Head: Atraumatic. Nose: No congestion/rhinnorhea. Mouth/Throat: Mucous membranes are moist.  Oropharynx non-erythematous. Cardiovascular: Normal rate, regular rhythm. Grossly normal heart sounds.  Good peripheral circulation. Respiratory: Normal respiratory  effort.  No retractions. Lungs CTAB. Gastrointestinal: Soft and nontender. No distention. Active bowel sounds Genitourinary: All external genitalia with some mild discharge and no significant bleeding. Cervix is closed no adnexal or uterine tenderness Musculoskeletal: No lower extremity tenderness nor edema.   Neurologic:  Normal speech and language.  Skin:  Skin is warm, dry and intact.  Psychiatric: Mood and affect are normal.   ____________________________________________   LABS (all labs ordered are listed, but only abnormal results are displayed)  Labs Reviewed  WET PREP, GENITAL - Abnormal; Notable for the following:    Yeast Wet Prep HPF POC  NEGATIVE (*)    Trich, Wet Prep MODERATE (*)    Clue Cells Wet Prep HPF POC FEW (*)    WBC, Wet Prep HPF POC MANY (*)    All other components within normal limits  HCG, QUANTITATIVE, PREGNANCY - Abnormal; Notable for the following:    hCG, Beta Chain, Quant, S [id number] (*)    All other components within normal limits  CBC WITH DIFFERENTIAL/PLATELET - Abnormal; Notable for the following:    Hemoglobin 11.1 (*)    HCT 34.9 (*)    MCV 75.0 (*)    MCH 23.9 (*)    MCHC 31.9 (*)    RDW 19.4 (*)    Neutro Abs 8.0 (*)    All other components within normal limits  CHLAMYDIA/NGC RT PCR ([facility] ONLY)  ABO/RH   ____________________________________________  EKG  None ____________________________________________  RADIOLOGY  Ultrasound: Single intrauterine pregnancy, 6 weeks and 6 days, large subchorionic hemorrhage. ____________________________________________   PROCEDURES  Procedure(s) performed: None  Critical Care performed: No  ____________________________________________   INITIAL IMPRESSION / ASSESSMENT AND PLAN / ED COURSE  Pertinent labs & imaging results that were available during my care of the patient were reviewed by me and considered in my medical decision making (see chart for details).  This is a 24 year old female who comes to the hospital today with vaginal bleeding and some lower abdominal cramping. It appears as though the patient does have some clue cells as well as Trichomonas. I will give the patient a dose of metronidazole and have her follow up with OB/GYN. The patient does have cardiac activity but this is still considered a threatened miscarriage due to the bleeding. I will discuss the results with the patient and discharge her to home. The patient's blood type is A+ so she does not need any further treatment for her blood type and vaginal bleeding. ____________________________________________   FINAL CLINICAL IMPRESSION(S) / ED DIAGNOSES  Final  diagnoses:  Vaginal bleeding      Loney Hering, MD 08/21/15 [phone]

## 2015-08-21 NOTE — ED Notes (Signed)
Pt states she is [redacted] weeks pregnant and has been experiencing pelvic pain, low back pain and vaginal bleeding for 20 minutes. Pt states one pad use since bleeding began, skin normal color warm and dry.

## 2015-08-21 NOTE — ED Notes (Signed)
Patient transported to Ultrasound 

## 2015-10-17 DIAGNOSIS — D649 Anemia, unspecified: Secondary | ICD-10-CM | POA: Insufficient documentation

## 2015-10-17 DIAGNOSIS — R1110 Vomiting, unspecified: Secondary | ICD-10-CM | POA: Insufficient documentation

## 2015-11-08 DIAGNOSIS — O0992 Supervision of high risk pregnancy, unspecified, second trimester: Secondary | ICD-10-CM | POA: Insufficient documentation

## 2016-01-09 ENCOUNTER — Observation Stay
Admission: EM | Admit: 2016-01-09 | Discharge: 2016-01-09 | Disposition: A | Discharge status: Home / Self Care | Payer: Medicaid Other | Attending: Obstetrics and Gynecology | Admitting: Obstetrics and Gynecology

## 2016-01-09 DIAGNOSIS — O368190 Decreased fetal movements, unspecified trimester, not applicable or unspecified: Secondary | ICD-10-CM | POA: Diagnosis present

## 2016-01-09 DIAGNOSIS — Z3A27 27 weeks gestation of pregnancy: Secondary | ICD-10-CM | POA: Diagnosis not present

## 2016-01-09 DIAGNOSIS — O368120 Decreased fetal movements, second trimester, not applicable or unspecified: Secondary | ICD-10-CM | POA: Diagnosis not present

## 2016-01-09 NOTE — OB Triage Note (Signed)
Patient presents with decreased fetal movement since noon today.  Denies any vaginal bleeding/spotting, no c/o cramping or contractions.  Abdomen soft, non-tender.  efm and toco applied  fhr-stable.  Fetal movement auscultated and palpated.  Patient reassured.  MD notified of patient complaint and assessment via phone.  Awaiting orders.

## 2016-01-09 NOTE — Procedures (Cosign Needed)
NST baseline of 145, moderate variability, +accels, one variable BPP 8/8 vertex presentation, posterior fundal placenta, AFI of 17.0cm

## 2016-01-09 NOTE — Final Progress Note (Signed)
Physician Final Progress Note  Patient ID: Courtney Gordon MRN: [id number] DOB/AGE: [birth year] 27 y.o.  Admit date: 01/09/2016 Admitting provider: Malachy Mood, MD Discharge date: 01/09/2016   Admission Diagnoses: Decreased fetal movement  Discharge Diagnoses:  Active Problems:   Decreased fetal movement   Consults: None  Significant Findings/ Diagnostic Studies: Reactive NST, BPP 8/8  Procedures:  NST FHT 145, moderate variability, on variable, +10x10 accels BPP 8/8, vtx posterior fundal placenta, AFI 17cm  Discharge Condition: good  Disposition: 01-Home or Self Care  Diet: Regular diet  Discharge Activity: Activity as tolerated  Discharge Instructions    Discharge activity:  No Restrictions    Complete by:  As directed      Discharge diet:  No restrictions    Complete by:  As directed      No sexual activity restrictions    Complete by:  As directed      Notify physician for a general feeling that "something is not right"    Complete by:  As directed      Notify physician for increase or change in vaginal discharge    Complete by:  As directed      Notify physician for intestinal cramps, with or without diarrhea, sometimes described as "gas pain"    Complete by:  As directed      Notify physician for leaking of fluid    Complete by:  As directed      Notify physician for low, dull backache, unrelieved by heat or Tylenol    Complete by:  As directed      Notify physician for menstrual like cramps    Complete by:  As directed      Notify physician for pelvic pressure    Complete by:  As directed      Notify physician for uterine contractions.  These may be painless and feel like the uterus is tightening or the baby is  "balling up"    Complete by:  As directed      Notify physician for vaginal bleeding    Complete by:  As directed      PRETERM LABOR:  Includes any of the follwing symptoms that occur between 20 - [redacted] weeks gestation.  If these symptoms are  not stopped, preterm labor can result in preterm delivery, placing your baby at risk    Complete by:  As directed             Medication List    ASK your doctor about these medications        sulfamethoxazole-trimethoprim 800-160 MG tablet  Commonly known as:  BACTRIM DS,SEPTRA DS  Take 2 tablets by mouth 2 (two) times daily.     traMADol 50 MG tablet  Commonly known as:  ULTRAM  Take 1 tablet (50 mg total) by mouth 2 (two) times daily.         Total time spent taking care of this patient: 30 minutes  Signed: Dorthula Nettles 01/09/2016, 8:58 PM

## 2016-01-18 DIAGNOSIS — O1200 Gestational edema, unspecified trimester: Secondary | ICD-10-CM | POA: Insufficient documentation

## 2016-02-01 DIAGNOSIS — O26843 Uterine size-date discrepancy, third trimester: Secondary | ICD-10-CM | POA: Insufficient documentation

## 2016-02-28 ENCOUNTER — Inpatient Hospital Stay: Payer: Medicaid Other | Admitting: Anesthesiology

## 2016-02-28 ENCOUNTER — Encounter
Admission: EM | Disposition: A | Discharge status: Home / Self Care | Payer: Self-pay | Source: Home / Self Care | Attending: Obstetrics and Gynecology

## 2016-02-28 ENCOUNTER — Encounter: Payer: Self-pay | Admitting: Advanced Practice Midwife

## 2016-02-28 ENCOUNTER — Inpatient Hospital Stay
Admission: EM | Admit: 2016-02-28 | Discharge: 2016-03-03 | DRG: 765 | Disposition: A | Discharge status: Home / Self Care | Payer: Medicaid Other | Attending: Obstetrics and Gynecology | Admitting: Obstetrics and Gynecology

## 2016-02-28 DIAGNOSIS — D6959 Other secondary thrombocytopenia: Secondary | ICD-10-CM | POA: Diagnosis present

## 2016-02-28 DIAGNOSIS — O4593 Premature separation of placenta, unspecified, third trimester: Principal | ICD-10-CM | POA: Diagnosis present

## 2016-02-28 DIAGNOSIS — I341 Nonrheumatic mitral (valve) prolapse: Secondary | ICD-10-CM | POA: Diagnosis present

## 2016-02-28 DIAGNOSIS — O9912 Other diseases of the blood and blood-forming organs and certain disorders involving the immune mechanism complicating childbirth: Secondary | ICD-10-CM | POA: Diagnosis present

## 2016-02-28 DIAGNOSIS — D62 Acute posthemorrhagic anemia: Secondary | ICD-10-CM | POA: Diagnosis present

## 2016-02-28 DIAGNOSIS — O9081 Anemia of the puerperium: Secondary | ICD-10-CM | POA: Diagnosis present

## 2016-02-28 DIAGNOSIS — Z8249 Family history of ischemic heart disease and other diseases of the circulatory system: Secondary | ICD-10-CM

## 2016-02-28 DIAGNOSIS — O9902 Anemia complicating childbirth: Secondary | ICD-10-CM | POA: Diagnosis present

## 2016-02-28 DIAGNOSIS — O99413 Diseases of the circulatory system complicating pregnancy, third trimester: Secondary | ICD-10-CM

## 2016-02-28 DIAGNOSIS — D509 Iron deficiency anemia, unspecified: Secondary | ICD-10-CM | POA: Diagnosis present

## 2016-02-28 DIAGNOSIS — O99013 Anemia complicating pregnancy, third trimester: Secondary | ICD-10-CM | POA: Diagnosis present

## 2016-02-28 DIAGNOSIS — Z87891 Personal history of nicotine dependence: Secondary | ICD-10-CM

## 2016-02-28 DIAGNOSIS — Z3A34 34 weeks gestation of pregnancy: Secondary | ICD-10-CM | POA: Diagnosis not present

## 2016-02-28 DIAGNOSIS — O321XX0 Maternal care for breech presentation, not applicable or unspecified: Secondary | ICD-10-CM | POA: Diagnosis present

## 2016-02-28 DIAGNOSIS — Z98891 History of uterine scar from previous surgery: Secondary | ICD-10-CM

## 2016-02-28 LAB — CBC
HCT: 27.3 % — ABNORMAL LOW (ref 35.0–47.0)
HEMOGLOBIN: 8.7 g/dL — AB (ref 12.0–16.0)
MCH: 23 pg — AB (ref 26.0–34.0)
MCHC: 32 g/dL (ref 32.0–36.0)
MCV: 71.8 fL — ABNORMAL LOW (ref 80.0–100.0)
PLATELETS: 178 10*3/uL (ref 150–440)
RBC: 3.8 MIL/uL (ref 3.80–5.20)
RDW: 18.2 % — ABNORMAL HIGH (ref 11.5–14.5)
WBC: 9.3 10*3/uL (ref 3.6–11.0)

## 2016-02-28 LAB — RAPID HIV SCREEN (HIV 1/2 AB+AG)
HIV 1/2 ANTIBODIES: NONREACTIVE
HIV-1 P24 ANTIGEN - HIV24: NONREACTIVE

## 2016-02-28 LAB — KLEIHAUER-BETKE STAIN
FETAL CELLS %: 0 %
Quantitation Fetal Hemoglobin: 0 mL

## 2016-02-28 LAB — URINE DRUG SCREEN, QUALITATIVE (ARMC ONLY)
Amphetamines, Ur Screen: NOT DETECTED
Barbiturates, Ur Screen: NOT DETECTED
Benzodiazepine, Ur Scrn: NOT DETECTED
CANNABINOID 50 NG, UR ~~LOC~~: NOT DETECTED
COCAINE METABOLITE, UR ~~LOC~~: NOT DETECTED
MDMA (ECSTASY) UR SCREEN: NOT DETECTED
Methadone Scn, Ur: NOT DETECTED
OPIATE, UR SCREEN: NOT DETECTED
PHENCYCLIDINE (PCP) UR S: NOT DETECTED
Tricyclic, Ur Screen: NOT DETECTED

## 2016-02-28 LAB — PROTIME-INR
INR: 1.17
PROTHROMBIN TIME: 15.1 s — AB (ref 11.4–15.0)

## 2016-02-28 LAB — FIBRINOGEN: Fibrinogen: 343 mg/dL (ref 210–470)

## 2016-02-28 LAB — APTT: aPTT: 31 seconds (ref 24–36)

## 2016-02-28 SURGERY — Surgical Case
Anesthesia: Regional

## 2016-02-28 MED ORDER — BUPIVACAINE HCL (PF) 0.5 % IJ SOLN: 5.0000 mL | Freq: Once | INTRAMUSCULAR | Status: DC

## 2016-02-28 MED ORDER — BUPIVACAINE HCL 0.5 % IJ SOLN
INTRAMUSCULAR | Status: DC | PRN
  Administered 2016-02-28: 19 mL

## 2016-02-28 MED ORDER — CEFAZOLIN SODIUM-DEXTROSE 2-4 GM/100ML-% IV SOLN
INTRAVENOUS | Status: AC
  Filled 2016-02-28: qty 100

## 2016-02-28 MED ORDER — PRENATAL MULTIVITAMIN CH
1.0000 | ORAL_TABLET | Freq: Every day | ORAL | Status: DC
  Administered 2016-02-28 – 2016-03-03 (×5): 1 via ORAL
  Filled 2016-02-28 (×6): qty 1

## 2016-02-28 MED ORDER — IBUPROFEN 600 MG PO TABS
600.0000 mg | ORAL_TABLET | Freq: Four times a day (QID) | ORAL | Status: AC | PRN
  Administered 2016-02-28 (×2): 600 mg via ORAL
  Filled 2016-02-28 (×2): qty 1

## 2016-02-28 MED ORDER — IBUPROFEN 600 MG PO TABS
600.0000 mg | ORAL_TABLET | Freq: Four times a day (QID) | ORAL | Status: DC
  Administered 2016-02-29: 600 mg via ORAL
  Filled 2016-02-28: qty 1

## 2016-02-28 MED ORDER — OXYCODONE-ACETAMINOPHEN 5-325 MG PO TABS
1.0000 | ORAL_TABLET | ORAL | Status: DC | PRN
  Administered 2016-02-29 – 2016-03-03 (×5): 1 via ORAL
  Filled 2016-02-28 (×4): qty 1

## 2016-02-28 MED ORDER — MEPERIDINE HCL 25 MG/ML IJ SOLN: 6.2500 mg | INTRAMUSCULAR | Status: DC | PRN

## 2016-02-28 MED ORDER — NALOXONE HCL 0.4 MG/ML IJ SOLN
0.4000 mg | INTRAMUSCULAR | Status: DC | PRN
Start: 2016-02-28 — End: 2016-03-02

## 2016-02-28 MED ORDER — ONDANSETRON HCL 4 MG/2ML IJ SOLN: 4.0000 mg | Freq: Three times a day (TID) | INTRAMUSCULAR | Status: DC | PRN

## 2016-02-28 MED ORDER — CEFAZOLIN (ANCEF) 1 G IV SOLR: 1.0000 g | INTRAVENOUS | Status: DC

## 2016-02-28 MED ORDER — BUPIVACAINE HCL (PF) 0.5 % IJ SOLN
INTRAMUSCULAR | Status: DC | PRN
  Administered 2016-02-28: 10 mL

## 2016-02-28 MED ORDER — CEFAZOLIN IN D5W 1 GM/50ML IV SOLN
INTRAVENOUS | Status: DC
Start: 2016-02-28 — End: 2016-02-28
  Filled 2016-02-28: qty 50

## 2016-02-28 MED ORDER — SENNOSIDES-DOCUSATE SODIUM 8.6-50 MG PO TABS
2.0000 | ORAL_TABLET | ORAL | Status: DC
  Administered 2016-03-02: 2 via ORAL
  Filled 2016-02-28: qty 2

## 2016-02-28 MED ORDER — OXYCODONE-ACETAMINOPHEN 5-325 MG PO TABS: 2.0000 | ORAL_TABLET | ORAL | Status: DC | PRN

## 2016-02-28 MED ORDER — MORPHINE SULFATE (PF) 2 MG/ML IV SOLN
1.0000 mg | INTRAVENOUS | Status: AC | PRN
  Administered 2016-02-28: 1 mg via INTRAVENOUS

## 2016-02-28 MED ORDER — SODIUM CHLORIDE 0.9% FLUSH: 3.0000 mL | INTRAVENOUS | Status: DC | PRN

## 2016-02-28 MED ORDER — WITCH HAZEL-GLYCERIN EX PADS: 1.0000 "application " | MEDICATED_PAD | CUTANEOUS | Status: DC | PRN

## 2016-02-28 MED ORDER — PHENYLEPHRINE HCL 10 MG/ML IJ SOLN
INTRAMUSCULAR | Status: DC | PRN
  Administered 2016-02-28 (×4): 100 ug via INTRAVENOUS

## 2016-02-28 MED ORDER — FENTANYL CITRATE (PF) 100 MCG/2ML IJ SOLN
INTRAMUSCULAR | Status: DC | PRN
  Administered 2016-02-28: 50 ug via INTRAVENOUS
  Administered 2016-02-28: 20 ug via INTRATHECAL
  Administered 2016-02-28 (×2): 50 ug via INTRAVENOUS

## 2016-02-28 MED ORDER — NALBUPHINE HCL 10 MG/ML IJ SOLN: 5.0000 mg | Freq: Once | INTRAMUSCULAR | Status: DC | PRN

## 2016-02-28 MED ORDER — DIPHENHYDRAMINE HCL 50 MG/ML IJ SOLN: 12.5000 mg | INTRAMUSCULAR | Status: DC | PRN

## 2016-02-28 MED ORDER — BUPIVACAINE ON-Q PAIN PUMP (FOR ORDER SET NO CHG)
INJECTION | Status: AC
  Filled 2016-02-28: qty 1

## 2016-02-28 MED ORDER — DIBUCAINE 1 % RE OINT: 1.0000 "application " | TOPICAL_OINTMENT | RECTAL | Status: DC | PRN

## 2016-02-28 MED ORDER — MENTHOL 3 MG MT LOZG
1.0000 | LOZENGE | OROMUCOSAL | Status: DC | PRN
  Filled 2016-02-28: qty 9

## 2016-02-28 MED ORDER — NALOXONE HCL 2 MG/2ML IJ SOSY
1.0000 ug/kg/h | PREFILLED_SYRINGE | INTRAVENOUS | Status: DC | PRN
  Filled 2016-02-28: qty 2

## 2016-02-28 MED ORDER — BREAST MILK
ORAL | Status: DC
  Filled 2016-02-28: qty 1

## 2016-02-28 MED ORDER — NALBUPHINE HCL 10 MG/ML IJ SOLN
5.0000 mg | INTRAMUSCULAR | Status: DC | PRN
  Administered 2016-02-28 – 2016-02-29 (×2): 5 mg via INTRAVENOUS
  Filled 2016-02-28 (×2): qty 1

## 2016-02-28 MED ORDER — LACTATED RINGERS IV SOLN
INTRAVENOUS | Status: DC | PRN
  Administered 2016-02-28: 06:00:00 via INTRAVENOUS

## 2016-02-28 MED ORDER — MORPHINE SULFATE (PF) 0.5 MG/ML IJ SOLN
INTRAMUSCULAR | Status: DC | PRN
  Administered 2016-02-28: .2 mg via INTRATHECAL

## 2016-02-28 MED ORDER — COCONUT OIL OIL
1.0000 | TOPICAL_OIL | Status: DC | PRN
Start: 2016-02-28 — End: 2016-03-03
  Filled 2016-02-28 (×2): qty 120

## 2016-02-28 MED ORDER — SCOPOLAMINE 1 MG/3DAYS TD PT72: 1.0000 | MEDICATED_PATCH | Freq: Once | TRANSDERMAL | Status: DC

## 2016-02-28 MED ORDER — MORPHINE SULFATE (PF) 2 MG/ML IV SOLN
INTRAVENOUS | Status: AC
  Administered 2016-02-28: 1 mg via INTRAVENOUS
  Filled 2016-02-28: qty 1

## 2016-02-28 MED ORDER — SIMETHICONE 80 MG PO CHEW
80.0000 mg | CHEWABLE_TABLET | Freq: Three times a day (TID) | ORAL | Status: DC
  Administered 2016-02-28 – 2016-03-03 (×10): 80 mg via ORAL
  Filled 2016-02-28 (×10): qty 1

## 2016-02-28 MED ORDER — SOD CITRATE-CITRIC ACID 500-334 MG/5ML PO SOLN: 30.0000 mL | ORAL | Status: DC

## 2016-02-28 MED ORDER — NALBUPHINE HCL 10 MG/ML IJ SOLN: 5.0000 mg | INTRAMUSCULAR | Status: DC | PRN

## 2016-02-28 MED ORDER — BUPIVACAINE IN DEXTROSE 0.75-8.25 % IT SOLN
INTRATHECAL | Status: DC | PRN
  Administered 2016-02-28: 1.5 mL via INTRATHECAL

## 2016-02-28 MED ORDER — FERROUS SULFATE 325 (65 FE) MG PO TABS
325.0000 mg | ORAL_TABLET | Freq: Two times a day (BID) | ORAL | Status: DC
  Administered 2016-02-28 – 2016-03-01 (×4): 325 mg via ORAL
  Filled 2016-02-28 (×4): qty 1

## 2016-02-28 MED ORDER — BUPIVACAINE 0.25 % ON-Q PUMP DUAL CATH 400 ML: 400.0000 mL | INJECTION | Status: DC

## 2016-02-28 MED ORDER — SOD CITRATE-CITRIC ACID 500-334 MG/5ML PO SOLN
ORAL | Status: AC
  Administered 2016-02-28: 30 mL
  Filled 2016-02-28: qty 15

## 2016-02-28 MED ORDER — ONDANSETRON HCL 4 MG/2ML IJ SOLN
INTRAMUSCULAR | Status: DC | PRN
  Administered 2016-02-28: 4 mg via INTRAVENOUS

## 2016-02-28 MED ORDER — DIPHENHYDRAMINE HCL 25 MG PO CAPS
25.0000 mg | ORAL_CAPSULE | ORAL | Status: DC | PRN
  Filled 2016-02-28: qty 1

## 2016-02-28 MED ORDER — BUPIVACAINE 0.25 % ON-Q PUMP DUAL CATH 400 ML
INJECTION | Status: AC
  Filled 2016-02-28: qty 400

## 2016-02-28 MED ORDER — OXYTOCIN 40 UNITS IN LACTATED RINGERS INFUSION - SIMPLE MED: 2.5000 [IU]/h | INTRAVENOUS | Status: AC

## 2016-02-28 MED ORDER — OXYTOCIN 40 UNITS IN LACTATED RINGERS INFUSION - SIMPLE MED
INTRAVENOUS | Status: DC | PRN
  Administered 2016-02-28: 200 mL via INTRAVENOUS
  Administered 2016-02-28: 1000 mL via INTRAVENOUS

## 2016-02-28 MED ORDER — LACTATED RINGERS IV SOLN
INTRAVENOUS | Status: DC
  Administered 2016-02-28 – 2016-02-29 (×2): via INTRAVENOUS

## 2016-02-28 MED ORDER — DIPHENHYDRAMINE HCL 25 MG PO CAPS: 25.0000 mg | ORAL_CAPSULE | Freq: Four times a day (QID) | ORAL | Status: DC | PRN

## 2016-02-28 MED ORDER — CEFAZOLIN SODIUM-DEXTROSE 2-4 GM/100ML-% IV SOLN: 2.0000 g | Freq: Three times a day (TID) | INTRAVENOUS | Status: DC

## 2016-02-28 MED ORDER — BUPIVACAINE HCL (PF) 0.5 % IJ SOLN
INTRAMUSCULAR | Status: AC
  Filled 2016-02-28: qty 30

## 2016-02-28 SURGICAL SUPPLY — 32 items
CANISTER SUCT 3000ML (MISCELLANEOUS) ×3 IMPLANT
CATH KIT ON-Q SILVERSOAK 5IN (CATHETERS) ×6 IMPLANT
CLOSURE WOUND 1/2 X4 (GAUZE/BANDAGES/DRESSINGS) ×1
DRSG OPSITE POSTOP 4X10 (GAUZE/BANDAGES/DRESSINGS) ×3 IMPLANT
DRSG TELFA 3X8 NADH (GAUZE/BANDAGES/DRESSINGS) ×3 IMPLANT
ELECT CAUTERY BLADE 6.4 (BLADE) ×3 IMPLANT
ELECT REM PT RETURN 9FT ADLT (ELECTROSURGICAL) ×3
ELECTRODE REM PT RTRN 9FT ADLT (ELECTROSURGICAL) ×1 IMPLANT
GAUZE SPONGE 4X4 12PLY STRL (GAUZE/BANDAGES/DRESSINGS) ×6 IMPLANT
GLOVE BIO SURGEON STRL SZ7 (GLOVE) ×9 IMPLANT
GLOVE INDICATOR 7.5 STRL GRN (GLOVE) ×9 IMPLANT
GOWN STRL REUS W/ TWL LRG LVL3 (GOWN DISPOSABLE) ×3 IMPLANT
GOWN STRL REUS W/TWL LRG LVL3 (GOWN DISPOSABLE) ×6
LIQUID BAND (GAUZE/BANDAGES/DRESSINGS) ×3 IMPLANT
NS IRRIG 1000ML POUR BTL (IV SOLUTION) ×3 IMPLANT
PACK C SECTION AR (MISCELLANEOUS) ×3 IMPLANT
PAD ABD DERMACEA PRESS 5X9 (GAUZE/BANDAGES/DRESSINGS) ×3 IMPLANT
PAD OB MATERNITY 4.3X12.25 (PERSONAL CARE ITEMS) ×3 IMPLANT
PAD PREP 24X41 OB/GYN DISP (PERSONAL CARE ITEMS) ×3 IMPLANT
SPONGE LAP 18X18 5 PK (GAUZE/BANDAGES/DRESSINGS) IMPLANT
STRIP CLOSURE SKIN 1/2X4 (GAUZE/BANDAGES/DRESSINGS) ×2 IMPLANT
SUT CHROMIC GUT BROWN 0 54 (SUTURE) IMPLANT
SUT CHROMIC GUT BROWN 0 54IN (SUTURE)
SUT MNCRL 4-0 (SUTURE) ×2
SUT MNCRL 4-0 27XMFL (SUTURE) ×1
SUT PDS AB 1 TP1 96 (SUTURE) ×3 IMPLANT
SUT PLAIN 2 0 XLH (SUTURE) IMPLANT
SUT VIC AB 0 CT1 36 (SUTURE) ×6 IMPLANT
SUT VIC AB 3-0 SH 27 (SUTURE) ×2
SUT VIC AB 3-0 SH 27X BRD (SUTURE) ×1 IMPLANT
SUTURE MNCRL 4-0 27XMF (SUTURE) ×1 IMPLANT
SWABSTK COMLB BENZOIN TINCTURE (MISCELLANEOUS) ×3 IMPLANT

## 2016-02-28 NOTE — Op Note (Addendum)
Cesarean Section Procedure Note   Courtney Gordon   02/28/2016   Pre-operative Diagnosis:  Patient Active Problem List   Diagnosis Date Noted  . Placental abruption in third trimester 02/28/2016  . Supervision of high risk pregnancy in third trimester 02/28/2016  . Maternal mitral valve prolapse affecting pregnancy in third trimester, antepartum 02/28/2016  . [redacted] weeks gestation of pregnancy 02/28/2016  . Anemia affecting pregnancy in third trimester, antepartum 02/28/2016  Breech presentation of infant   Post-operative Diagnosis:  Patient Active Problem List   Diagnosis Date Noted  . Placental abruption in third trimester 02/28/2016  . Supervision of high risk pregnancy in third trimester 02/28/2016  . Maternal mitral valve prolapse affecting pregnancy in third trimester, antepartum 02/28/2016  . [redacted] weeks gestation of pregnancy 02/28/2016  . Anemia affecting pregnancy in third trimester, antepartum 02/28/2016  Breech presentation of infant  Procedure:  Primary Low transverse section section via Pfannenstiel incision with double-layer closure  Surgeon: Surgeon(s) and Role:    * Will Bonnet, MD - Primary   Anesthesia: spinal   Findings:  1) normal appearing gravid uterus and ovaries with blunted appearing fallopian tubes in the fimbriated area 2) viable female infant in complete breech presentation   Estimated Blood Loss: 750 mL  Total IV Fluids: 1,800 ml   Urine Output: 300 mL  Specimens: Placenta for final  Complications: no complications  Disposition: PACU - hemodynamically stable.   Maternal Condition: stable   Baby condition / location:  NICU  Procedure Details:  The patient was seen in the Holding Room. The risks, benefits, complications, treatment options, and expected outcomes were discussed with the patient. The patient concurred with the proposed plan, giving informed consent. identified as [name] and the procedure verified as  C-Section Delivery. A Time Out was held and the above information confirmed.   After induction of anesthesia, the patient was draped and prepped in the usual sterile manner. A Pfannenstiel incision was made and carried down through the subcutaneous tissue to the fascia. Fascial incision was made and extended transversely. The fascia was separated from the underlying rectus tissue superiorly and inferiorly. The peritoneum was identified and entered. Peritoneal incision was extended longitudinally. The bladder flap was not bluntly freed from the lower uterine segment. A low transverse uterine incision was made and the hysterotomy was extended with cranial-caudal tension. It was noted that several large clots were apparent upon entry to the uterus.  Delivered from breech presentation was a 1,850 gram Living newborn infant(s) or Female with Apgar scores of 8 at one minute and 9 at five minutes. Cord ph was not sent the umbilical cord was clamped and cut cord blood was not obtained for evaluation. Note that the fetal breech, followed by the feet, were delivered initially.  A wet blue towel was placed around the infants lower back and using the Mauriceau-Smellie-Veit maneuver the fetal arms and head were delivered. The placenta was removed Intact and appeared normal. The uterine outline, tubes and ovaries appeared normal in the uterus and ovaries.  The fallopian tubes had a clubbed appearance bilaterally. The uterine incision was closed with running locked sutures of 0 Vicryl.  A second layer of the same suture was thrown in an imbricating fashion.  Hemostasis was assured.  The uterus was returned to the abdomen and the paracolic gutters were cleared of all clots and debris.  The rectus muscles were inspected and found to be hemostatic.  The On-Q catheter pumps were inserted in  accordance with the manufacturer's recommendations.  The catheters were inserted approximately 4cm cephelad to the incision line,  approximately 1cm apart, straddling the midline.  They were inserted to a depth of the 4th mark. They were positioned superficial to the rectus abdominus muscles and deep to the rectus fascia.    The fascia was then reapproximated with running sutures of 1-0 PDS, looped. Three 3-0 Vicryl stitches where thrown along the incision line to reduce tension on the skin closure. The subcuticular closure was performed using 4-0 monocryl. The skin closure was reinforced using surgical skin glue.  The On-Q catheters were bolused with 5 mL of 0.5% marcaine plain for a total of 10 mL.  The catheters were affixed to the skin with surgical skin glue, steri-strips, and tegaderm.    Instrument, sponge, and needle counts were correct prior the abdominal closure and were correct at the conclusion of the case.  The patient received Ancef 2 gram IV prior to skin incision (within 30 minutes). For VTE prophylaxis she was wearing SCDs throughout the case.  Signed: Will Bonnet, MD 02/28/2016 7:57 AM

## 2016-02-28 NOTE — Transfer of Care (Signed)
Immediate Anesthesia Transfer of Care Note  Patient: Courtney Gordon  Procedure(s) Performed: Procedure(s): CESAREAN SECTION (N/A)  Patient Location: Women's Unit  Anesthesia Type:Spinal  Level of Consciousness: awake, alert  and oriented  Airway & Oxygen Therapy: Patient Spontanous Breathing  Post-op Assessment: Report given to RN and Post -op Vital signs reviewed and stable  Post vital signs: Reviewed and stable  Last Vitals:  Filed Vitals:   02/28/16 0427 02/28/16 0517  BP: 103/59 106/55  Pulse: 86 87  Temp: 36.8 C   Resp: 18     Last Pain:  Filed Vitals:   02/28/16 0817  PainSc: 7          Complications: No apparent anesthesia complications

## 2016-02-28 NOTE — Plan of Care (Signed)
Pericare and bath completed. Pt ready for transfer to [facility] via bed in stable condition. Pt will be taken by NBN to see infant if ok'd per nursery staff. Pt has family members at bedside with her. Lenore Cordia RNC

## 2016-02-28 NOTE — Discharge Summary (Signed)
OB Discharge Summary  Patient Name: Courtney Gordon DOB: 10-10-1990 MRN: [id number]  Date of admission: 02/28/2016 Delivering MD: Dalia Heading, CNM  Date of Delivery: 02/28/2016  Date of discharge: 03/03/2016  Admitting diagnosis:  Patient Active Problem List   Diagnosis Date Noted  . Acute blood loss anemia 03/01/2016  . Placental abruption in third trimester 02/28/2016  . Postpartum care following cesarean delivery 02/28/2016  . Maternal mitral valve prolapse affecting pregnancy in third trimester, antepartum 02/28/2016  . Anemia affecting pregnancy in third trimester, antepartum 02/28/2016  . Status post cesarean section 02/28/2016    Intrauterine pregnancy: [redacted]w[redacted]d      Secondary diagnosis: Anemia     Discharge diagnosis: Preterm Pregnancy Delivered  / Placental abruption. Iron deficiency anemia worsened with acute blood loss.                                                                                              Post partum procedures:blood transfusion of 2 units PRBCs  Augmentation: N/A  Complications: Placental Abruption  Hospital course:  The patient was admitted to Labor and Delivery with active, heavy vaginal bleeding.  A bedside ultrasound was performed showing breech presentation and low to no fluid.  She continued to have vaginal bleeding with passage of clots.  Given high concern for active abruption, she was taken to the OR for primary low-transverse cesarean delivery, which occurred without incident. Her postpartum course was complicated by anemia with hemoglobin bottoming out at 5.8 gm/dl. After two units of packed red blood cells her hemoglobin rose to 8.1 gm/dl. Her vital signs remained stable despite her anemia. She was also begun on iron and vitamin supplementation. By POD #4 she was tolerating a regular diet, had normal return of bowel and bladder function, was afebrile, ambulating without feeling lightheaded, and her lochia was minimal. She started  pumping milk for her baby and had problems with cracked/ bleeding nipples then with engorgement and planned on slowly decreasing then stopping pumping and breast feeding.  Physical exam  Filed Vitals:   03/02/16 2024 03/03/16 0101 03/03/16 0520 03/03/16 0822  BP: 113/65 109/57  103/64  Pulse: 87 75  72  Temp: 98.7 F (37.1 C) 98.6 F (37 C) 98.6 F (37 C) 98.5 F (36.9 C)  TempSrc: Oral Oral Oral Oral  Resp: 18 17  18   Height:      Weight:      SpO2: 100% 100%  98%   General: alert, cooperative and no distress  Breasts: engorged/ very tender Heart: RRR without murmur Lungs: CTAB Lochia: appropriate Uterine Fundus: firm/ U-1/ ML/ NT Abdomen: soft/ NT/ bowel sounds active. ON Q pump removed Incision: Healing well with no significant drainage DVT Evaluation: Calf/Ankle edema is present Compression hose on  Labs: Lab Results  Component Value Date   WBC 6.7 03/02/2016   HGB 8.1* 03/02/2016   HCT 25.3* 03/02/2016   MCV 74.0* 03/02/2016   PLT 152 03/02/2016   CMP Latest Ref Rng 03/02/2016  Glucose 65 - 99 mg/dL 90  BUN 6 - 20 mg/dL 13  Creatinine 0.44 - 1.00 mg/dL 0.50  Sodium  135 - 145 mmol/L 140  Potassium 3.5 - 5.1 mmol/L 3.6  Chloride 101 - 111 mmol/L 111  CO2 22 - 32 mmol/L 24  Calcium 8.9 - 10.3 mg/dL 8.2(L)  Total Protein 6.5 - 8.1 g/dL 5.2(L)  Total Bilirubin 0.3 - 1.2 mg/dL 0.6  Alkaline Phos 38 - 126 U/L 46  AST 15 - 41 U/L 24  ALT 14 - 54 U/L 18    Discharge instruction: per After Visit Summary.  Medications:    Medication List    TAKE these medications        ferrous fumarate 325 (106 Fe) MG Tabs tablet  Commonly known as:  HEMOCYTE - 106 mg FE  Take 1 tablet (106 mg of iron total) by mouth daily.     ibuprofen 600 MG tablet  Commonly known as:  ADVIL,MOTRIN  Take 1 tablet (600 mg total) by mouth every 6 (six) hours as needed for mild pain, moderate pain or cramping.     multivitamin-prenatal 27-0.8 MG Tabs tablet  Take 1 tablet by mouth  daily at 12 noon.     oxyCODONE-acetaminophen 5-325 MG tablet  Commonly known as:  PERCOCET/ROXICET  Take 1 tablet by mouth every 4 (four) hours as needed for moderate pain or severe pain (pain score 7-10).     prenatal multivitamin Tabs tablet  Take 1 tablet by mouth daily at 12 noon.        Diet: routine diet  Activity: Advance as tolerated. Pelvic rest for 6 weeks.   Outpatient follow up: Follow-up Information    Follow up with Will Bonnet, MD In 1 week.   Specialty:  Obstetrics and Gynecology   Why:  incision check- will call patient with appt [phone]   Contact information:   [address] [place] [address] [phone]        Postpartum contraception: IUD Mirena Rhogam Given postpartum: no Rubella vaccine given postpartum: no Varicella vaccine given postpartum: no TDaP given antepartum or postpartum: antepartum 01/18/16 Flu vaccine given antepartum or postpartum: patient declined  Newborn Data: Live born female  Birth Weight: 4 lb 1.3 oz (1850 g) APGAR: 8, 9  Baby Feeding: Bottle breast milk and formula  Disposition:NICU  Dalia Heading, CNM 03/03/2016 9:43 AM

## 2016-02-28 NOTE — Progress Notes (Signed)
Called to see pt who presents at 34 weeks for vaginal bleeding since 0350 this am with large initial gush and continued significant bleeding.  BS US showing breech position of baby Speculum exam performed, golf ball sized blood clot noted and moderate flow of bright red blood. Exam discontinued.  Dr Glennon Mac notified. See his note re: full H&P

## 2016-02-28 NOTE — Anesthesia Preprocedure Evaluation (Signed)
Anesthesia Evaluation  Patient identified by MRN, date of birth, ID band Patient awake    Reviewed: Allergy & Precautions, H&P , NPO status , Patient's Chart, lab work & pertinent test results, reviewed documented beta blocker date and time   Airway Mallampati: III  TM Distance: >3 FB Neck ROM: full    Dental no notable dental hx. (+) Teeth Intact   Pulmonary neg pulmonary ROS, former smoker,    Pulmonary exam normal breath sounds clear to auscultation       Cardiovascular Exercise Tolerance: Good negative cardio ROS Normal cardiovascular exam Rhythm:regular Rate:Normal     Neuro/Psych negative neurological ROS  negative psych ROS   GI/Hepatic negative GI ROS, Neg liver ROS,   Endo/Other  negative endocrine ROS  Renal/GU negative Renal ROS  negative genitourinary   Musculoskeletal   Abdominal   Peds  Hematology negative hematology ROS (+) anemia ,   Anesthesia Other Findings Situation discussed with OB and pt.  Mother with possible partial abruption but no tachycardia or hypotension and maternal strips reveal baby to be stable.  No current bleeding reported. Based on airway considerations and associated risks, will proceed with SAB.  Plan accepted by pt.  JA  Reproductive/Obstetrics (+) Pregnancy                             Anesthesia Physical Anesthesia Plan  ASA: II and emergent  Anesthesia Plan: Regional and Spinal   Post-op Pain Management:    Induction:   Airway Management Planned:   Additional Equipment:   Intra-op Plan:   Post-operative Plan:   Informed Consent: I have reviewed the patients History and Physical, chart, labs and discussed the procedure including the risks, benefits and alternatives for the proposed anesthesia with the patient or authorized representative who has indicated his/her understanding and acceptance.     Plan Discussed with: CRNA  Anesthesia  Plan Comments:         Anesthesia Quick Evaluation

## 2016-02-28 NOTE — H&P (Signed)
OB History & Physical   History of Present Illness:  Chief Complaint: vaginal bleeding  HPI:  Courtney Gordon is a 25 y.o. G44P1011 female at [redacted]w[redacted]d dated by a 6 weeks ultrasound with EDD 04/09/16.  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    She reports contractions.   She notes no obvious leakage of amniotic fluid.   She reports vaginal bleeding.   She reports fetal movement.   She noted a sensation of a large gush of fluid at 350 AM today. She noted a large amount of vaginal bleeding, which has continued.  She denies smoking and drug use.   She received TDAP on 01/18/16 She had a maternal ECHO this pregnancy that showed normal right and left ventricular function, with EF of >55%.  Last fetal ultrasound for growth was on 02/15/16.  [place]: EFW 1581g / 3 lb 8 oz (11%), AC 13%, AFI 8.2 cm, Breech   Maternal Medical History:   Past Medical History  Diagnosis Date  . Anemia   . Heart murmur     s/p ECHO during this pregnancy with EF >55%    Past Surgical History  Procedure Laterality Date  . Tonsillectomy     Allergies: No Known Allergies  Prior to Admission medications   Medication Sig Start Date End Date Taking? Authorizing Provider  Prenatal Vit-Fe Fumarate-FA (MULTIVITAMIN-PRENATAL) 27-0.8 MG TABS tablet Take 1 tablet by mouth daily at 12 noon.   Yes Historical Provider, MD    OB History  Gravida Para Term Preterm AB SAB TAB Ectopic Multiple Living  3 1 1  1 1    1     # Outcome Date GA Lbr Len/2nd Weight Sex Delivery Anes PTL Lv  3 Current           2 SAB           1 Term               Prenatal care site: [facility]  Social History: She  reports that she has quit smoking. Her smoking use included Cigarettes. She has never used smokeless tobacco. She reports that she does not drink alcohol or use illicit drugs.  Family History: family history includes Hypertension in her maternal grandmother.   Review of Systems:  Negative x 10 systems reviewed except as noted in the HPI.    Physical Exam:  Vital Signs: BP 106/55 mmHg  Pulse 87  Temp(Src) 98.3 F (36.8 C) (Oral)  Resp 18  Ht 5\' 6"  (1.676 m)  Wt 160 lb (72.576 kg)  BMI 25.84 kg/m2  LMP 06/17/2015 General: no acute distress.  HEENT: normocephalic, atraumatic Heart: regular rate & rhythm.  No murmurs/rubs/gallops Lungs: clear to auscultation bilaterally Abdomen: soft, gravid, non-tender;  EFW: 5 pounds Pelvic:   External: Normal external female genitalia  Cervix:   /   /     + large amount of blood with clot in the vaginal vault, bright red with dark clot (golf ball size) Extremities: non-tender, symmetric, no edema bilaterally.  DTRs: 2+  Neurologic: Alert & oriented x 3.    Pertinent Results:  Prenatal Labs: Blood type/Rh A positive  Antibody screen negative  Rubella Immune  Varicella Immune    RPR NR  HBsAg negative  HIV negative  GC negative  Chlamydia negative  Genetic screening Negative first trimester screen, declined CF carrier screening, normal hemoglobin electrophoresis  1 hour GTT 92  3 hour GTT n/a  GBS unknown   Baseline FHR: 135 beats/min   Variability: moderate   Accelerations: present   Decelerations: absent Contractions: present frequency: 4-5 q 10 min Overall assessment: category 1  Bedside Ultrasound:  Number of Fetus: 1  Presentation: complete breech  Fluid: low, no formal measurement  Placental Location: posterior  Assessment:  Courtney Gordon is a 25 y.o. G76P1011 female at [redacted]w[redacted]d with placental abruption.  It appears she may have had rupture of membranes with resultant partial placental abruption.  However, I can't definitively say she had rupture of membranes.  Discussed management of partial abruption.  She is 34 week.  Benefit of monitoring would be to get steroids administered for fetal well being.  However, I do not believe delay of delivery would be prudent at this time. We did discuss possibility  of delaying delivery and that has with it certain benefits and risks.  Mutual decision made to proceed with delivery at this time.  NICU aware.  I personally reviewed risks/benefits of cesarean section in this case.    Plan:  1. Admit to Labor & Delivery  2. CBC, T&S, NPO, IVF 3. GBS unknown.   4. Fetwal well-being: reassuring and category 1 at this time. 5. To operating room for primary cesarean section.  Will attempt to place spinal given overall clinical picture.  Anesthesia aware. 6. Will get UDS, PT/PTT/INR, fibrinogen, CBC, CMP.   Will Bonnet, MD 02/28/2016 6:19 AM

## 2016-02-29 ENCOUNTER — Encounter: Payer: Self-pay | Admitting: *Deleted

## 2016-02-29 LAB — CBC WITH DIFFERENTIAL/PLATELET
BASOS ABS: 0 10*3/uL (ref 0–0.1)
BASOS PCT: 0 %
EOS ABS: 0 10*3/uL (ref 0–0.7)
EOS PCT: 1 %
HCT: 19.1 % — ABNORMAL LOW (ref 35.0–47.0)
HEMOGLOBIN: 6.2 g/dL — AB (ref 12.0–16.0)
LYMPHS ABS: 1.3 10*3/uL (ref 1.0–3.6)
Lymphocytes Relative: 20 %
MCH: 23 pg — ABNORMAL LOW (ref 26.0–34.0)
MCHC: 32.3 g/dL (ref 32.0–36.0)
MCV: 71.1 fL — ABNORMAL LOW (ref 80.0–100.0)
Monocytes Absolute: 0.4 10*3/uL (ref 0.2–0.9)
Monocytes Relative: 6 %
NEUTROS PCT: 73 %
Neutro Abs: 4.7 10*3/uL (ref 1.4–6.5)
PLATELETS: 135 10*3/uL — AB (ref 150–440)
RBC: 2.69 MIL/uL — AB (ref 3.80–5.20)
RDW: 18.3 % — ABNORMAL HIGH (ref 11.5–14.5)
WBC: 6.4 10*3/uL (ref 3.6–11.0)

## 2016-02-29 LAB — CBC
HEMATOCRIT: 17.7 % — AB (ref 35.0–47.0)
HEMOGLOBIN: 5.8 g/dL — AB (ref 12.0–16.0)
MCH: 23.4 pg — ABNORMAL LOW (ref 26.0–34.0)
MCHC: 32.7 g/dL (ref 32.0–36.0)
MCV: 71.5 fL — ABNORMAL LOW (ref 80.0–100.0)
Platelets: 135 10*3/uL — ABNORMAL LOW (ref 150–440)
RBC: 2.48 MIL/uL — AB (ref 3.80–5.20)
RDW: 18.1 % — ABNORMAL HIGH (ref 11.5–14.5)
WBC: 6.1 10*3/uL (ref 3.6–11.0)

## 2016-02-29 LAB — RPR: RPR Ser Ql: NONREACTIVE

## 2016-02-29 LAB — SURGICAL PATHOLOGY

## 2016-02-29 MED ORDER — IBUPROFEN 600 MG PO TABS
600.0000 mg | ORAL_TABLET | Freq: Four times a day (QID) | ORAL | Status: DC
Start: 2016-02-29 — End: 2016-03-03
  Administered 2016-02-29 – 2016-03-03 (×12): 600 mg via ORAL
  Filled 2016-02-29 (×13): qty 1

## 2016-02-29 NOTE — Clinical Documentation Improvement (Signed)
Obstetrics at [facility]  Please document query responses in the progress notes and discharge summary, not on the CDI BPA form in [facility].   Thank you!  "Anemia" and "Anemia affecting pregnancy in third trimester, antepartum" is documented in the current medical record.  Patient had c-section due to placental abruption with active bleeding. 02/28/16 Admission H&H - 8.7/27.3  02/29/16 Postop H&H - 5.8/17.7   EBL per dictated op note 750 ml  Please document if a condition below provides greater specificity regarding the patient's anemia following c-section delivery:  - Acute Blood Loss Anemia   - Other type of anemia  - Unable to clinically determine   Please exercise your independent, professional judgment when responding. A specific answer is not anticipated or expected.   Thank You, Erling Conte  RN BSN CCDS [phone] Health Information Management [place]

## 2016-02-29 NOTE — Progress Notes (Addendum)
Post Op Day 1 Subjective:   Pt is doing well at a little more than 24 hours postpartum when I saw her at 0930 this am. She is tolerating PO intake and ambulating with a care provider next to her. She is voiding without difficulty. She denies feeling lightheaded or short of breath. Her pain is well controlled with PO meds and ON Q Pump. Pt was ambulating without difficulty to the NICU at approximately 1100- she declined blood transfusion that was offered at that time.  Objective:  Blood pressure 84/43, pulse 73, temperature 98.4 F (36.9 C), temperature source Oral, resp. rate 16, height 5\' 6"  (1.676 m), weight 72.576 kg (160 lb), last menstrual period 06/17/2015, SpO2 100 %  General: NAD Pulmonary: no increased work of breathing Abdomen: non-distended, non-tender, fundus firm at level of umbilicus Incision: C/D/I Extremities: no edema, no erythema, no tenderness  Results for orders placed or performed during the hospital encounter of 02/28/16 (from the past 24 hour(s))  CBC     Status: Abnormal   Collection Time: 02/29/16  5:42 AM  Result Value Ref Range   WBC 6.1 3.6 - 11.0 K/uL   RBC 2.48 (L) 3.80 - 5.20 MIL/uL   Hemoglobin 5.8 (L) 12.0 - 16.0 g/dL   HCT 17.7 (L) 35.0 - 47.0 %   MCV 71.5 (L) 80.0 - 100.0 fL   MCH 23.4 (L) 26.0 - 34.0 pg   MCHC 32.7 32.0 - 36.0 g/dL   RDW 18.1 (H) 11.5 - 14.5 %   Platelets 135 (L) 150 - 440 K/uL   @I /O24@   Assessment:   25 y.o. [id number] postoperativeday # 1   Plan:  1) Acute blood loss anemia - hemodynamically stable and asymptomatic - po ferrous sulfate, Unit of PRBC offered to patient due to low Hgb/Hct  2) A+, Rubella Immune, Varicella Immune  3) TDAP UTD   4) Breast/Bottle/Contraception: Considering Mirena  5) Disposition: Home- Day of DC depends on PP progress   Deneene Tarver, CNM

## 2016-02-29 NOTE — Anesthesia Postprocedure Evaluation (Signed)
Anesthesia Post Note  Patient: Courtney Gordon  Procedure(s) Performed: Procedure(s) (LRB): CESAREAN SECTION (N/A)  Patient location during evaluation: Mother Baby Anesthesia Type: Spinal Level of consciousness: awake and alert Pain management: satisfactory to patient Vital Signs Assessment: post-procedure vital signs reviewed and stable Respiratory status: spontaneous breathing Cardiovascular status: stable Postop Assessment: no backache, no headache, spinal receding and patient able to bend at knees Anesthetic complications: no    Last Vitals:  Filed Vitals:   02/29/16 0120 02/29/16 0349  BP: 95/43 95/41  Pulse: 69 78  Temp: 36.8 C 36.8 C  Resp: 18 18    Last Pain:  Filed Vitals:   02/29/16 0422  PainSc: 7                  Rolla Plate P

## 2016-02-29 NOTE — Anesthesia Post-op Follow-up Note (Signed)
  Anesthesia Pain Follow-up Note  Patient: Courtney Gordon  Day #: 1  Date of Follow-up: 02/29/2016 Time: 7:20 AM  Last Vitals:  Filed Vitals:   02/29/16 0120 02/29/16 0349  BP: 95/43 95/41  Pulse: 69 78  Temp: 36.8 C 36.8 C  Resp: 18 18    Level of Consciousness: alert  Pain: mild   Side Effects:None  Catheter Site Exam:clean     Plan: D/C from anesthesia care  Brantley Fling

## 2016-03-01 DIAGNOSIS — D62 Acute posthemorrhagic anemia: Secondary | ICD-10-CM | POA: Diagnosis not present

## 2016-03-01 LAB — COMPREHENSIVE METABOLIC PANEL
ALT: 22 U/L (ref 14–54)
AST: 28 U/L (ref 15–41)
Albumin: 2.6 g/dL — ABNORMAL LOW (ref 3.5–5.0)
Alkaline Phosphatase: 55 U/L (ref 38–126)
Anion gap: 5 (ref 5–15)
BUN: 10 mg/dL (ref 6–20)
CO2: 24 mmol/L (ref 22–32)
Calcium: 8.7 mg/dL — ABNORMAL LOW (ref 8.9–10.3)
Chloride: 109 mmol/L (ref 101–111)
Creatinine, Ser: 0.5 mg/dL (ref 0.44–1.00)
GFR calc Af Amer: >60 mL/min (ref >60)
GFR calc non Af Amer: >60 mL/min (ref >60)
Glucose, Bld: 84 mg/dL (ref 65–99)
Potassium: 3.6 mmol/L (ref 3.5–5.1)
Sodium: 138 mmol/L (ref 135–145)
Total Bilirubin: <0.1 mg/dL — ABNORMAL LOW (ref 0.3–1.2)
Total Protein: 5.8 g/dL — ABNORMAL LOW (ref 6.5–8.1)

## 2016-03-01 LAB — CBC
HEMATOCRIT: 19.6 % — AB (ref 35.0–47.0)
Hemoglobin: 6.5 g/dL — ABNORMAL LOW (ref 12.0–16.0)
MCH: 23.6 pg — ABNORMAL LOW (ref 26.0–34.0)
MCHC: 32.8 g/dL (ref 32.0–36.0)
MCV: 71.9 fL — ABNORMAL LOW (ref 80.0–100.0)
Platelets: 168 10*3/uL (ref 150–440)
RBC: 2.73 MIL/uL — ABNORMAL LOW (ref 3.80–5.20)
RDW: 18.7 % — AB (ref 11.5–14.5)
WBC: 5.6 10*3/uL (ref 3.6–11.0)

## 2016-03-01 MED ORDER — FERROUS SULFATE 325 (65 FE) MG PO TABS
325.0000 mg | ORAL_TABLET | Freq: Three times a day (TID) | ORAL | Status: DC
  Administered 2016-03-01 – 2016-03-03 (×6): 325 mg via ORAL
  Filled 2016-03-01 (×6): qty 1

## 2016-03-01 MED ORDER — SODIUM CHLORIDE 0.9 % IV SOLN: Freq: Once | INTRAVENOUS | Status: DC

## 2016-03-01 MED FILL — Bupivacaine HCl Inj 0.25%: Qty: 400 | Status: AC

## 2016-03-01 NOTE — Progress Notes (Signed)
  Subjective:   Patient doing well, tolerating pain with PO meds (has ON-Q pump), ambulating independently to NICU, voiding spontaneously, tolerating regular diet.  Denies fatigue, SOB, dizzy/lightheadedness.  Objective:  Blood pressure 102/56, pulse 92, temperature 98 F (36.7 C), temperature source Oral, resp. rate 16, SpO2 99 %,.  General: NAD CV: systolic murmur, RRR Pulmonary: no increased work of breathing, CTABL Abdomen: non-distended, non-tender, fundus firm at level of umbilicus Incision: C/ D/ I, on-q pump in place Extremities: no edema, no erythema, no tenderness  850 UOP this shift  Assessment:   25 y.o. [id number] postoperativeday # 2 from STAT cesarean due to placental abruption at 34 weeks.   Plan:  1) Acute blood loss anemia - hemodynamically stable and asymptomatic -po ferrous sulfate changed to TID -CBC from yesterday showed anemia and thrombocytopenia likely due to significant blood loss from abruption.  -will re-draw CBC now and if <7.0 highly recommend at least 1 unit PRBC for increased oxygen delivery to end organs, patient will consider after results.  2) Baby - admitted to NICU due to prematurity, but doing well.  3) TDAP status - given 01/18/16  4) Breast/Bottle/Contraception: pumping for baby in NICU  5) Disposition: continue routine postpartum/postop care; inpatient admission  ----- Larey Days, MD Attending Obstetrician and Gynecologist [facility]

## 2016-03-02 LAB — COMPREHENSIVE METABOLIC PANEL
ALT: 18 U/L (ref 14–54)
AST: 24 U/L (ref 15–41)
Albumin: 2.3 g/dL — ABNORMAL LOW (ref 3.5–5.0)
Alkaline Phosphatase: 46 U/L (ref 38–126)
Anion gap: 5 (ref 5–15)
BUN: 13 mg/dL (ref 6–20)
CHLORIDE: 111 mmol/L (ref 101–111)
CO2: 24 mmol/L (ref 22–32)
CREATININE: 0.5 mg/dL (ref 0.44–1.00)
Calcium: 8.2 mg/dL — ABNORMAL LOW (ref 8.9–10.3)
GFR calc non Af Amer: >60 mL/min (ref >60)
Glucose, Bld: 90 mg/dL (ref 65–99)
POTASSIUM: 3.6 mmol/L (ref 3.5–5.1)
SODIUM: 140 mmol/L (ref 135–145)
Total Bilirubin: 0.6 mg/dL (ref 0.3–1.2)
Total Protein: 5.2 g/dL — ABNORMAL LOW (ref 6.5–8.1)

## 2016-03-02 LAB — CBC
HCT: 20.4 % — ABNORMAL LOW (ref 35.0–47.0)
Hemoglobin: 6.7 g/dL — ABNORMAL LOW (ref 12.0–16.0)
MCH: 24.3 pg — ABNORMAL LOW (ref 26.0–34.0)
MCHC: 32.8 g/dL (ref 32.0–36.0)
MCV: 74 fL — ABNORMAL LOW (ref 80.0–100.0)
PLATELETS: 152 10*3/uL (ref 150–440)
RBC: 2.76 MIL/uL — AB (ref 3.80–5.20)
RDW: 20.3 % — AB (ref 11.5–14.5)
WBC: 6.7 10*3/uL (ref 3.6–11.0)

## 2016-03-02 LAB — PREPARE RBC (CROSSMATCH)

## 2016-03-02 LAB — HEMOGLOBIN AND HEMATOCRIT, BLOOD
HCT: 25.3 % — ABNORMAL LOW (ref 35.0–47.0)
HEMOGLOBIN: 8.1 g/dL — AB (ref 12.0–16.0)

## 2016-03-02 MED ORDER — ACETAMINOPHEN 325 MG PO TABS
650.0000 mg | ORAL_TABLET | Freq: Once | ORAL | Status: AC
Start: 2016-03-02 — End: 2016-03-02
  Administered 2016-03-02: 650 mg via ORAL
  Filled 2016-03-02: qty 2

## 2016-03-02 MED ORDER — SODIUM CHLORIDE 0.9 % IV SOLN
Freq: Once | INTRAVENOUS | Status: AC
  Administered 2016-03-02: 12:00:00 via INTRAVENOUS

## 2016-03-02 MED ORDER — DIPHENHYDRAMINE HCL 25 MG PO CAPS
25.0000 mg | ORAL_CAPSULE | Freq: Once | ORAL | Status: AC
  Administered 2016-03-02: 25 mg via ORAL

## 2016-03-02 NOTE — Progress Notes (Signed)
POD #3 s/p Cesarean section for placental abruption Subjective:   Tired. Has cracked bleeding nipples from pumping and her milk is in. Tolerating regular diet, had BM and is passing flatus. Ambulating without lightheadedness. Courtney Gordon remains in NICU but is now feeding from the bottle  Objective:  Blood pressure 99/51, pulse 90, temperature 98.3 F (36.8 C), temperature source Oral, resp. rate 16, height '5\' 6"'$  (1.676 m), weight 72.576 kg (160 lb), last menstrual period 06/17/2015, SpO2 100 %, unknown if currently breastfeeding.  General: NAD Pulmonary: no increased work of breathing/ CTAB Heart: RRR without murmur Abdomen: non-distended, non-tender, fundus firm at level of umbilicus, BS active Incision: C+D+I Extremities: pedal edema,is wearing compression hose  Results for orders placed or performed during the hospital encounter of 02/28/16 (from the past 72 hour(s))  CBC     Status: Abnormal   Collection Time: 02/29/16  5:42 AM  Result Value Ref Range   WBC 6.1 3.6 - 11.0 K/uL   RBC 2.48 (L) 3.80 - 5.20 MIL/uL   Hemoglobin 5.8 (L) 12.0 - 16.0 g/dL    Comment: RESULT REPEATED AND VERIFIED   HCT 17.7 (L) 35.0 - 47.0 %   MCV 71.5 (L) 80.0 - 100.0 fL   MCH 23.4 (L) 26.0 - 34.0 pg   MCHC 32.7 32.0 - 36.0 g/dL   RDW 18.1 (H) 11.5 - 14.5 %   Platelets 135 (L) 150 - 440 K/uL  CBC with Differential/Platelet     Status: Abnormal   Collection Time: 02/29/16  1:35 PM  Result Value Ref Range   WBC 6.4 3.6 - 11.0 K/uL   RBC 2.69 (L) 3.80 - 5.20 MIL/uL   Hemoglobin 6.2 (L) 12.0 - 16.0 g/dL   HCT 19.1 (L) 35.0 - 47.0 %   MCV 71.1 (L) 80.0 - 100.0 fL   MCH 23.0 (L) 26.0 - 34.0 pg   MCHC 32.3 32.0 - 36.0 g/dL   RDW 18.3 (H) 11.5 - 14.5 %   Platelets 135 (L) 150 - 440 K/uL   Neutrophils Relative % 73 %   Neutro Abs 4.7 1.4 - 6.5 K/uL   Lymphocytes Relative 20 %   Lymphs Abs 1.3 1.0 - 3.6 K/uL   Monocytes Relative 6 %   Monocytes Absolute 0.4 0.2 - 0.9 K/uL   Eosinophils Relative 1 %    Eosinophils Absolute 0.0 0 - 0.7 K/uL   Basophils Relative 0 %   Basophils Absolute 0.0 0 - 0.1 K/uL  CBC     Status: Abnormal   Collection Time: 03/01/16  5:31 PM  Result Value Ref Range   WBC 5.6 3.6 - 11.0 K/uL   RBC 2.73 (L) 3.80 - 5.20 MIL/uL   Hemoglobin 6.5 (L) 12.0 - 16.0 g/dL   HCT 19.6 (L) 35.0 - 47.0 %   MCV 71.9 (L) 80.0 - 100.0 fL   MCH 23.6 (L) 26.0 - 34.0 pg   MCHC 32.8 32.0 - 36.0 g/dL   RDW 18.7 (H) 11.5 - 14.5 %   Platelets 168 150 - 440 K/uL  Comprehensive metabolic panel     Status: Abnormal   Collection Time: 03/01/16  5:31 PM  Result Value Ref Range   Sodium 138 135 - 145 mmol/L   Potassium 3.6 3.5 - 5.1 mmol/L   Chloride 109 101 - 111 mmol/L   CO2 24 22 - 32 mmol/L   Glucose, Bld 84 65 - 99 mg/dL   BUN 10 6 - 20 mg/dL   Creatinine, Ser  0.50 0.44 - 1.00 mg/dL   Calcium 8.7 (L) 8.9 - 10.3 mg/dL   Total Protein 5.8 (L) 6.5 - 8.1 g/dL   Albumin 2.6 (L) 3.5 - 5.0 g/dL   AST 28 15 - 41 U/L   ALT 22 14 - 54 U/L   Alkaline Phosphatase 55 38 - 126 U/L   Total Bilirubin <0.1 (L) 0.3 - 1.2 mg/dL   GFR calc non Af Amer >60 >60 mL/min   GFR calc Af Amer >60 >60 mL/min    Comment: (NOTE) The eGFR has been calculated using the CKD EPI equation. This calculation has not been validated in all clinical situations. eGFR's persistently <60 mL/min signify possible Chronic Kidney Disease.    Anion gap 5 5 - 15  Prepare RBC     Status: None   Collection Time: 03/01/16  7:30 PM  Result Value Ref Range   Order Confirmation ORDER PROCESSED BY BLOOD BANK   CBC     Status: Abnormal   Collection Time: 03/02/16  5:23 AM  Result Value Ref Range   WBC 6.7 3.6 - 11.0 K/uL   RBC 2.76 (L) 3.80 - 5.20 MIL/uL   Hemoglobin 6.7 (L) 12.0 - 16.0 g/dL   HCT 20.4 (L) 35.0 - 47.0 %   MCV 74.0 (L) 80.0 - 100.0 fL   MCH 24.3 (L) 26.0 - 34.0 pg   MCHC 32.8 32.0 - 36.0 g/dL   RDW 20.3 (H) 11.5 - 14.5 %   Platelets 152 150 - 440 K/uL  Comprehensive metabolic panel     Status:  Abnormal   Collection Time: 03/02/16  5:23 AM  Result Value Ref Range   Sodium 140 135 - 145 mmol/L   Potassium 3.6 3.5 - 5.1 mmol/L   Chloride 111 101 - 111 mmol/L   CO2 24 22 - 32 mmol/L   Glucose, Bld 90 65 - 99 mg/dL   BUN 13 6 - 20 mg/dL   Creatinine, Ser 0.50 0.44 - 1.00 mg/dL   Calcium 8.2 (L) 8.9 - 10.3 mg/dL   Total Protein 5.2 (L) 6.5 - 8.1 g/dL   Albumin 2.3 (L) 3.5 - 5.0 g/dL   AST 24 15 - 41 U/L   ALT 18 14 - 54 U/L   Alkaline Phosphatase 46 38 - 126 U/L   Total Bilirubin 0.6 0.3 - 1.2 mg/dL   GFR calc non Af Amer >60 >60 mL/min   GFR calc Af Amer >60 >60 mL/min    Comment: (NOTE) The eGFR has been calculated using the CKD EPI equation. This calculation has not been validated in all clinical situations. eGFR's persistently <60 mL/min signify possible Chronic Kidney Disease.    Anion gap 5 5 - 15  Prepare RBC     Status: None   Collection Time: 03/02/16 10:43 AM  Result Value Ref Range   Order Confirmation ORDER PROCESSED BY BLOOD BANK   Received one unit of blood yesterday which elevated hmg from 5.8 to 6.7gm/dl   Assessment:   25 y.o. [id number] postoperativeday # 3     Plan:  1) Acute blood loss anemia - hemoglobin remains below 7 after one unit of blood. Will transfuse with another unit today. Patient is amenable to that POM.  repeat H&H 4 hours post transfusion. Continue iron supplementation.   2)A POS/ RI/ VI  3) TDAP UTD   4) Patient desires to stop pumping due to painful, bleeding nipples. Is using gel packs and cool packs. Lactation consulted.  5)  Disposition-   Dalia Heading, CNM

## 2016-03-02 NOTE — Lactation Note (Signed)
This note was copied from a baby's chart. Lactation Consultation Note  Patient Name: Girl Terrin Gair Today's Date: 03/02/2016     Maternal Data   Mom pumping breasts now every 2-3 hrs, breasts full, given 30 mm flanges to use with pump as 27 mm were still uncomfortable , pumping 2 oz plus each session, swelling in axilla, instructed to gently massage these areas befiore pumping, pump if uncomfortable between full pumping sessions. Feeding Feeding Type: Breast Fed Length of feed: 10 min  LATCH Score/Interventions                      Lactation Tools Discussed/Used     Consult Status      Ferol Luz 03/02/2016, 5:24 PM

## 2016-03-03 LAB — TYPE AND SCREEN
ABO/RH(D): A POS
ANTIBODY SCREEN: NEGATIVE
UNIT DIVISION: 0
UNIT DIVISION: 0

## 2016-03-03 MED ORDER — PRENATAL MULTIVITAMIN CH: 1.0000 | ORAL_TABLET | Freq: Every day | ORAL | Status: DC

## 2016-03-03 MED ORDER — FERROUS FUMARATE 325 (106 FE) MG PO TABS: 1.0000 | ORAL_TABLET | Freq: Every day | ORAL | Status: DC

## 2016-03-03 MED ORDER — IBUPROFEN 600 MG PO TABS: 600.0000 mg | ORAL_TABLET | Freq: Four times a day (QID) | ORAL | Status: DC | PRN

## 2016-03-03 MED ORDER — OXYCODONE-ACETAMINOPHEN 5-325 MG PO TABS: 1.0000 | ORAL_TABLET | ORAL | Status: DC | PRN

## 2016-03-03 NOTE — Progress Notes (Signed)
Patient discharged home. Vital signs stable, bleeding within normal limits, uterus firm. Discharge instructions, prescriptions, and follow up appointment given to and reviewed with patient. Patient verbalized understanding, all questions answered. Infant remains in SCN. Patient declined wheelchair, ambulated out with family.   Jonne Ply, RN

## 2016-03-03 NOTE — Lactation Note (Signed)
This note was copied from a baby's chart. Lactation Consultation Note  Patient Name: [name] Today's Date: 03/03/2016     Maternal Data  Mom has tenderness and a moderate size edematous mammary gland tissue at top of tail of spence in both the right and left axilla.  No supernumery nipple is evident.  Still encouraged mom to report lump to OB.  Feeding Feeding Type: Bottle Fed - Breast Milk Nipple Type: Slow - flow Length of feed: 20 min  LATCH Score/Interventions                      Lactation Tools Discussed/Used  Encouraged mom to pump bilaterally every 3 hours for 15 to 20 minutes.  Mom is to be discharged today.  Symphony pump loaned for a week until can get DEBP through [facility].  Faxed mom and baby's information to [facility].   Consult Status  Assisted mom pumping bilaterally and applying cabbage and ice packs. Explained importance of pumping around the clock every 2 to 3 hours into 5 oz bottles and then pour into 2 oz snappies.  Reviewed collection, storage and handling of breast milk even after she is discharged and continue to bring expressed breast milk in cooler to hospital once she is discharged home.  After pumping, until engorgement is relieved, place cold cabbage leaves on breasts and then ice pack on top.  Lactation name and number given and written on white board and encouraged to call for questions and concerns.    Jarold Motto 03/03/2016, 11:13 AM

## 2016-03-03 NOTE — Discharge Instructions (Signed)
Cesarean Delivery, Care After Refer to this sheet in the next few weeks. These instructions provide you with information on caring for yourself after your procedure. Your health care provider may also give you specific instructions. Your treatment has been planned according to current medical practices, but problems sometimes occur. Call your health care provider if you have any problems or questions after you go home. HOME CARE INSTRUCTIONS  Only take over-the-counter or prescription medications as directed by your health care provider.  Do not drink alcohol, especially if you are breastfeeding or taking medication to relieve pain.  Do not chew or smoke tobacco.  Continue to use good perineal care. Good perineal care includes:  Wiping your perineum from front to back.  Keeping your perineum clean.  Check your surgical cut (incision) daily for increased redness, drainage, swelling, or separation of skin.  Clean your incision gently with soap and water every day, and then pat it dry. If your health care provider says it is okay, leave the incision uncovered. Use a bandage (dressing) if the incision is draining fluid or appears irritated. If the adhesive strips across the incision do not fall off within 7 days, carefully peel them off.  Hug a pillow when coughing or sneezing until your incision is healed. This helps to relieve pain.  Do not use tampons or douche for the next 6 weeks.   Showers only, no tub baths for the next 6 weeks.   Wear a well-fitting bra that provides breast support.  Limit wearing support panties or control-top hose.  Drink enough fluids to keep your urine clear or pale yellow.  Eat high-fiber foods such as whole grain cereals and breads, brown rice, beans, and fresh fruits and vegetables every day. These foods may help prevent or relieve constipation.  Resume activities such as climbing stairs, driving, lifting, exercising, or traveling as directed by your  health care provider.  No sexual intercourse for at least the next 6 weeks, and then not until you feel ready.   Try to have someone help you with your household activities and your newborn for at least a few days after you leave the hospital.  Rest as much as possible. Try to rest or take a nap when your newborn is sleeping.  Increase your activities gradually.  Keep all of your scheduled postpartum appointments. It is very important to keep your scheduled follow-up appointments. At these appointments, your health care provider will be checking to make sure that you are healing physically and emotionally. SEEK MEDICAL CARE IF:   You are passing large clots from your vagina. Save any clots to show your health care provider.  You have a foul smelling discharge from your vagina.  You have trouble urinating.  You are urinating frequently.  You have pain when you urinate.  You have a change in your bowel movements.  You have increasing redness, pain, or swelling near your incision.  You have pus draining from your incision.  Your incision is separating.  You have painful, hard, or reddened breasts.  You have a severe headache.  You have blurred vision or see spots.  You feel sad or depressed.  You have thoughts of hurting yourself or your newborn.  You have questions about your care, the care of your newborn, or medications.  You are dizzy or light-headed.  You have a rash.  You have pain, redness, or swelling at the site of the removed intravenous access (IV) tube.  You have nausea or  vomiting.  You stopped breastfeeding and have not had a menstrual period within 12 weeks of stopping.  You are not breastfeeding and have not had a menstrual period within 12 weeks of delivery.  You have a fever. SEEK IMMEDIATE MEDICAL CARE IF:  You have persistent pain.  You have chest pain.  You have shortness of breath.  You faint.  You have leg pain.  You have stomach  pain.  Your vaginal bleeding saturates 2 or more sanitary pads in 1 hour. MAKE SURE YOU:   Understand these instructions.  Will watch your condition.  Will get help right away if you are not doing well or get worse.   This information is not intended to replace advice given to you by your health care provider. Make sure you discuss any questions you have with your health care provider.   Document Released: 05/12/2002 Document Revised: 09/10/2014 Document Reviewed: 04/16/2012 Elsevier Interactive Patient Education [employer].

## 2016-08-06 ENCOUNTER — Emergency Department: Payer: Medicaid Other

## 2016-08-06 ENCOUNTER — Encounter: Payer: Self-pay | Admitting: Emergency Medicine

## 2016-08-06 ENCOUNTER — Emergency Department
Admission: EM | Admit: 2016-08-06 | Discharge: 2016-08-06 | Disposition: A | Discharge status: Home / Self Care | Payer: Medicaid Other | Attending: Emergency Medicine | Admitting: Emergency Medicine

## 2016-08-06 DIAGNOSIS — Y929 Unspecified place or not applicable: Secondary | ICD-10-CM | POA: Insufficient documentation

## 2016-08-06 DIAGNOSIS — Z79899 Other long term (current) drug therapy: Secondary | ICD-10-CM | POA: Diagnosis not present

## 2016-08-06 DIAGNOSIS — Y999 Unspecified external cause status: Secondary | ICD-10-CM | POA: Diagnosis not present

## 2016-08-06 DIAGNOSIS — Y9389 Activity, other specified: Secondary | ICD-10-CM | POA: Insufficient documentation

## 2016-08-06 DIAGNOSIS — F17290 Nicotine dependence, other tobacco product, uncomplicated: Secondary | ICD-10-CM | POA: Insufficient documentation

## 2016-08-06 DIAGNOSIS — S0990XA Unspecified injury of head, initial encounter: Secondary | ICD-10-CM | POA: Insufficient documentation

## 2016-08-06 MED ORDER — NAPROXEN 500 MG PO TBEC: 500.0000 mg | DELAYED_RELEASE_TABLET | Freq: Two times a day (BID) | ORAL | 0 refills | Status: DC

## 2016-08-06 MED ORDER — ACETAMINOPHEN 325 MG PO TABS
650.0000 mg | ORAL_TABLET | Freq: Once | ORAL | Status: AC
  Administered 2016-08-06: 650 mg via ORAL
  Filled 2016-08-06: qty 2

## 2016-08-06 MED ORDER — CYCLOBENZAPRINE HCL 5 MG PO TABS: 5.0000 mg | ORAL_TABLET | Freq: Three times a day (TID) | ORAL | 0 refills | Status: DC | PRN

## 2016-08-06 MED ORDER — ONDANSETRON 4 MG PO TBDP
4.0000 mg | ORAL_TABLET | Freq: Once | ORAL | Status: AC
  Administered 2016-08-06: 4 mg via ORAL
  Filled 2016-08-06: qty 1

## 2016-08-06 MED ORDER — HYDROCODONE-ACETAMINOPHEN 5-325 MG PO TABS: 1.0000 | ORAL_TABLET | Freq: Three times a day (TID) | ORAL | 0 refills | Status: DC | PRN
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
## 2016-08-06 NOTE — ED Notes (Signed)
Patient transported to CT 

## 2017-05-11 ENCOUNTER — Emergency Department
Admission: EM | Admit: 2017-05-11 | Discharge: 2017-05-11 | Disposition: A | Discharge status: Home / Self Care | Payer: Medicaid Other | Attending: Emergency Medicine | Admitting: Emergency Medicine

## 2017-05-11 ENCOUNTER — Encounter: Payer: Self-pay | Admitting: Emergency Medicine

## 2017-05-11 DIAGNOSIS — F17290 Nicotine dependence, other tobacco product, uncomplicated: Secondary | ICD-10-CM | POA: Diagnosis not present

## 2017-05-11 DIAGNOSIS — Z79899 Other long term (current) drug therapy: Secondary | ICD-10-CM | POA: Insufficient documentation

## 2017-05-11 DIAGNOSIS — A64 Unspecified sexually transmitted disease: Secondary | ICD-10-CM | POA: Diagnosis not present

## 2017-05-11 DIAGNOSIS — N898 Other specified noninflammatory disorders of vagina: Secondary | ICD-10-CM | POA: Diagnosis present

## 2017-05-11 LAB — COMPREHENSIVE METABOLIC PANEL
ALBUMIN: 4.2 g/dL (ref 3.5–5.0)
ALT: 11 U/L — ABNORMAL LOW (ref 14–54)
ANION GAP: 7 (ref 5–15)
AST: 20 U/L (ref 15–41)
Alkaline Phosphatase: 49 U/L (ref 38–126)
BUN: 10 mg/dL (ref 6–20)
CO2: 25 mmol/L (ref 22–32)
Calcium: 9.1 mg/dL (ref 8.9–10.3)
Chloride: 106 mmol/L (ref 101–111)
Creatinine, Ser: 0.83 mg/dL (ref 0.44–1.00)
GFR calc non Af Amer: >60 mL/min (ref >60)
GLUCOSE: 91 mg/dL (ref 65–99)
Potassium: 3.9 mmol/L (ref 3.5–5.1)
SODIUM: 138 mmol/L (ref 135–145)
TOTAL PROTEIN: 7.3 g/dL (ref 6.5–8.1)
Total Bilirubin: 0.5 mg/dL (ref 0.3–1.2)

## 2017-05-11 LAB — CBC
HEMATOCRIT: 29.6 % — AB (ref 35.0–47.0)
HEMOGLOBIN: 9.5 g/dL — AB (ref 12.0–16.0)
MCH: 23.4 pg — AB (ref 26.0–34.0)
MCHC: 32 g/dL (ref 32.0–36.0)
MCV: 73.1 fL — ABNORMAL LOW (ref 80.0–100.0)
Platelets: 221 10*3/uL (ref 150–440)
RBC: 4.04 MIL/uL (ref 3.80–5.20)
RDW: 19 % — ABNORMAL HIGH (ref 11.5–14.5)
WBC: 7.9 10*3/uL (ref 3.6–11.0)

## 2017-05-11 LAB — WET PREP, GENITAL
CLUE CELLS WET PREP: NONE SEEN
Sperm: NONE SEEN
Trich, Wet Prep: NONE SEEN
Yeast Wet Prep HPF POC: NONE SEEN

## 2017-05-11 LAB — URINALYSIS, COMPLETE (UACMP) WITH MICROSCOPIC
BACTERIA UA: NONE SEEN
BILIRUBIN URINE: NEGATIVE
GLUCOSE, UA: NEGATIVE mg/dL
HGB URINE DIPSTICK: NEGATIVE
KETONES UR: NEGATIVE mg/dL
Leukocytes, UA: NEGATIVE
NITRITE: POSITIVE — AB
PROTEIN: NEGATIVE mg/dL
Specific Gravity, Urine: 1.026 (ref 1.005–1.030)
pH: 6 (ref 5.0–8.0)

## 2017-05-11 LAB — CHLAMYDIA/NGC RT PCR (ARMC ONLY)
CHLAMYDIA TR: DETECTED — AB
N GONORRHOEAE: NOT DETECTED

## 2017-05-11 LAB — LIPASE, BLOOD: Lipase: 35 U/L (ref 11–51)

## 2017-05-11 LAB — POCT PREGNANCY, URINE: PREG TEST UR: NEGATIVE

## 2017-05-11 MED ORDER — AZITHROMYCIN 500 MG PO TABS
1000.0000 mg | ORAL_TABLET | Freq: Once | ORAL | Status: AC
  Administered 2017-05-11: 1000 mg via ORAL
  Filled 2017-05-11: qty 2

## 2017-05-11 MED ORDER — AZITHROMYCIN 1 G PO PACK: 1.0000 g | PACK | Freq: Once | ORAL | Status: DC

## 2017-05-11 MED ORDER — CEFTRIAXONE SODIUM 250 MG IJ SOLR
250.0000 mg | Freq: Once | INTRAMUSCULAR | Status: AC
  Administered 2017-05-11: 250 mg via INTRAMUSCULAR
  Filled 2017-05-11: qty 250

## 2017-05-11 NOTE — ED Notes (Signed)
Pt resting in bed with lights dimmed, respirations even and unlabored. Pt denies any needs. Will continue to monitor for further patient needs. Explained and apologized for delay.

## 2017-05-11 NOTE — ED Provider Notes (Addendum)
[facility] Emergency Department Provider Note  ____________________________________________  Time seen: Approximately 4:51 PM  I have reviewed the triage vital signs and the nursing notes.   HISTORY  Chief Complaint Vaginal Bleeding   HPI Courtney Gordon is a 26 y.o. female who presents for evaluation of the vaginal discharge and bleeding. Patient reports that she was at work this morning when she started having crampy abdominal pain consistent with her menstrual cramps. However reports that instead of blood she says she's been having large amount of foul-smelling dischargewith blood specks. At this time she denies any abdominal pain. She reports that the pain is intermittent cramps located in the suprapubic region, nonradiating. No nausea, no vomiting, no diarrhea, no dysuria, no fever, no chills, no prior history of STDs.  Past Medical History:  Diagnosis Date  . Anemia   . Heart murmur    s/p ECHO during this pregnancy with EF >55%    Patient Active Problem List   Diagnosis Date Noted  . Acute blood loss anemia 03/01/2016  . Placental abruption in third trimester 02/28/2016  . Postpartum care following cesarean delivery 02/28/2016  . Maternal mitral valve prolapse affecting pregnancy in third trimester, antepartum 02/28/2016  . Anemia affecting pregnancy in third trimester, antepartum 02/28/2016  . Status post cesarean section 02/28/2016    Past Surgical History:  Procedure Laterality Date  . CESAREAN SECTION N/A 02/28/2016   Procedure: CESAREAN SECTION;  Surgeon: Will Bonnet, MD;  Location: [facility] ORS;  Service: Obstetrics;  Laterality: N/A;  . TONSILLECTOMY      Prior to Admission medications   Medication Sig Start Date End Date Taking? Authorizing Provider  cyclobenzaprine (FLEXERIL) 5 MG tablet Take 1 tablet (5 mg total) by mouth 3 (three) times daily as needed for muscle spasms. 08/06/16   Menshew, Dannielle Karvonen, PA-C  ferrous  fumarate (HEMOCYTE - 106 MG FE) 325 (106 Fe) MG TABS tablet Take 1 tablet (106 mg of iron total) by mouth daily. 03/03/16   Dalia Heading, CNM  HYDROcodone-acetaminophen (NORCO) 5-325 MG tablet Take 1 tablet by mouth 3 (three) times daily as needed. 08/06/16   Menshew, Dannielle Karvonen, PA-C  ibuprofen (ADVIL,MOTRIN) 600 MG tablet Take 1 tablet (600 mg total) by mouth every 6 (six) hours as needed for mild pain, moderate pain or cramping. 03/03/16   Dalia Heading, CNM  naproxen (EC NAPROSYN) 500 MG EC tablet Take 1 tablet (500 mg total) by mouth 2 (two) times daily with a meal. 08/06/16   Menshew, Dannielle Karvonen, PA-C  oxyCODONE-acetaminophen (PERCOCET/ROXICET) 5-325 MG tablet Take 1 tablet by mouth every 4 (four) hours as needed for moderate pain or severe pain (pain score 7-10). 03/03/16   Dalia Heading, CNM  Prenatal Vit-Fe Fumarate-FA (MULTIVITAMIN-PRENATAL) 27-0.8 MG TABS tablet Take 1 tablet by mouth daily at 12 noon.    [provider]  Prenatal Vit-Fe Fumarate-FA (PRENATAL MULTIVITAMIN) TABS tablet Take 1 tablet by mouth daily at 12 noon. 03/03/16   Dalia Heading, CNM    Allergies Patient has no known allergies.  Family History  Problem Relation Age of Onset  . Hypertension Maternal Grandmother     Social History Social History  Substance Use Topics  . Smoking status: Current Every Day Smoker    Packs/day: 0.00    Types: Cigars  . Smokeless tobacco: Never Used  . Alcohol use Yes     Comment: occas    Review of Systems  Constitutional: Negative for fever. Eyes:  Negative for visual changes. ENT: Negative for sore throat. Neck: No neck pain  Cardiovascular: Negative for chest pain. Respiratory: Negative for shortness of breath. Gastrointestinal: Negative for abdominal pain, vomiting or diarrhea. Genitourinary: Negative for dysuria. + vaginal discharge Musculoskeletal: Negative for back pain. Skin: Negative for rash. Neurological: Negative for headaches,  weakness or numbness. Psych: No SI or HI  ____________________________________________   PHYSICAL EXAM:  VITAL SIGNS: ED Triage Vitals  Enc Vitals Group     BP 05/11/17 1351 (!) 109/58     Pulse Rate 05/11/17 1351 (!) 103     Resp 05/11/17 1351 20     Temp 05/11/17 1351 98.2 F (36.8 C)     Temp Source 05/11/17 1351 Oral     SpO2 05/11/17 1351 100 %     Weight 05/11/17 1352 140 lb (63.5 kg)     Height --      Head Circumference --      Peak Flow --      Pain Score 05/11/17 1351 8     Pain Loc --      Pain Edu? --      Excl. in [place]? --     Constitutional: Alert and oriented. Well appearing and in no apparent distress. HEENT:      Head: Normocephalic and atraumatic.         Eyes: Conjunctivae are normal. Sclera is non-icteric.       Mouth/Throat: Mucous membranes are moist.       Neck: Supple with no signs of meningismus. Cardiovascular: Regular rate and rhythm. No murmurs, gallops, or rubs. 2+ symmetrical distal pulses are present in all extremities. No JVD. Respiratory: Normal respiratory effort. Lungs are clear to auscultation bilaterally. No wheezes, crackles, or rhonchi.  Gastrointestinal: Soft, non tender, and non distended with positive bowel sounds. No rebound or guarding. Pelvic exam: Normal external genitalia, no rashes or lesions. Large amount of foul smelling watery discharge. Os closed. No cervical motion tenderness.  No uterine or adnexal tenderness.   Musculoskeletal: Nontender with normal range of motion in all extremities. No edema, cyanosis, or erythema of extremities. Neurologic: Normal speech and language. Face is symmetric. Moving all extremities. No gross focal neurologic deficits are appreciated. Skin: Skin is warm, dry and intact. No rash noted. Psychiatric: Mood and affect are normal. Speech and behavior are normal.  ____________________________________________   LABS (all labs ordered are listed, but only abnormal results are displayed)  Labs  Reviewed  WET PREP, GENITAL - Abnormal; Notable for the following:       Result Value   WBC, Wet Prep HPF POC TOO NUMEROUS TO COUNT (*)    All other components within normal limits  COMPREHENSIVE METABOLIC PANEL - Abnormal; Notable for the following:    ALT 11 (*)    All other components within normal limits  CBC - Abnormal; Notable for the following:    Hemoglobin 9.5 (*)    HCT 29.6 (*)    MCV 73.1 (*)    MCH 23.4 (*)    RDW 19.0 (*)    All other components within normal limits  URINALYSIS, COMPLETE (UACMP) WITH MICROSCOPIC - Abnormal; Notable for the following:    Color, Urine YELLOW (*)    APPearance CLEAR (*)    Nitrite POSITIVE (*)    Squamous Epithelial / LPF 0-5 (*)    All other components within normal limits  URINE CULTURE  CHLAMYDIA/NGC RT PCR ([facility] ONLY)  LIPASE, BLOOD  POC URINE PREG, ED  POCT PREGNANCY, URINE   ____________________________________________  EKG  none  ____________________________________________  RADIOLOGY  none  ____________________________________________   PROCEDURES  Procedure(s) performed: None Procedures Critical Care performed:  None ____________________________________________   INITIAL IMPRESSION / ASSESSMENT AND PLAN / ED COURSE  26 y.o. female who presents for evaluation of the vaginal discharge. Patient is well-appearing, in no distress, normal vitals, pelvic exam showing large amount of watery foul-smelling discharge, abdomen is soft with no tenderness throughout. Wet prep, GC and chlamydia have been sent. Pregnancy test is negative. Blood work with no acute findings other than iron deficiency anemia which is known to patient. Hemoglobin is as high as it has ever been. UA concerning for WBC and nitrite but no bacteria, probably from vaginal discharge. Will send urine culture.    _________________________ 6:48 PM on 05/11/2017 -----------------------------------------  Wet prep Negative. Patient received a  dose of IM Rocephin and azithromycin. GC and chlamydia are pending but based on my exam and concern for STD. Discussed that she needs her partner treated as well. There is no CMT therefore no concern for PID, no adnexal tenderness or abdominal tenderness therefore no clinical suspicion for tubo-ovarian abscess.  Pertinent labs & imaging results that were available during my care of the patient were reviewed by me and considered in my medical decision making (see chart for details).    ____________________________________________   FINAL CLINICAL IMPRESSION(S) / ED DIAGNOSES  Final diagnoses:  STD (female)      NEW MEDICATIONS STARTED DURING THIS VISIT:  New Prescriptions   No medications on file     Note:  This document was prepared using Dragon voice recognition software and may include unintentional dictation errors.    Alfred Levins, [place], MD 05/11/17 [place], [place], MD 05/15/17 [phone]

## 2017-05-11 NOTE — ED Triage Notes (Signed)
Pt to ed with c/o vaginal pain today and clear discharge with faint blood.  Pt reports she is unsure of when her last period was.  Pt reports pain is similar to menstrual cycle but reports no blood is coming out now.  Pt also reports foul smell to discharge.

## 2017-05-13 ENCOUNTER — Telehealth: Payer: Self-pay | Admitting: Emergency Medicine

## 2017-05-13 NOTE — Telephone Encounter (Signed)
Called patient to inform of std testing results.  She was treated in the ED.  Explained partner treatment and availability for free treatment at [facility]

## 2017-05-14 LAB — URINE CULTURE

## 2017-05-15 NOTE — Progress Notes (Addendum)
PHARMACY CONSULT NOTE - INITIAL   Pharmacy Consult for ED Culture Results   No Known Allergies  Patient Measurements: Weight: 140 lb (63.5 kg)   Microbiology: Recent Results (from the past 720 hour(s))  Urine Culture     Status: Abnormal   Collection Time: 05/11/17  1:50 PM  Result Value Ref Range Status   Specimen Description URINE, RANDOM  Final   Special Requests NONE  Final   Culture >=100,000 COLONIES/mL STAPHYLOCOCCUS EPIDERMIDIS (A)  Final   Report Status 05/14/2017 FINAL  Final   Organism ID, Bacteria STAPHYLOCOCCUS EPIDERMIDIS (A)  Final      Susceptibility   Staphylococcus epidermidis - MIC*    CIPROFLOXACIN <=0.5 SENSITIVE Sensitive     GENTAMICIN <=0.5 SENSITIVE Sensitive     NITROFURANTOIN <=16 SENSITIVE Sensitive     OXACILLIN RESISTANT Resistant     TETRACYCLINE <=1 SENSITIVE Sensitive     VANCOMYCIN 1 SENSITIVE Sensitive     TRIMETH/SULFA <=10 SENSITIVE Sensitive     CLINDAMYCIN >=8 RESISTANT Resistant     RIFAMPIN <=0.5 SENSITIVE Sensitive     Inducible Clindamycin NEGATIVE Sensitive     * >=100,000 COLONIES/mL STAPHYLOCOCCUS EPIDERMIDIS  Wet prep, genital     Status: Abnormal   Collection Time: 05/11/17  5:09 PM  Result Value Ref Range Status   Yeast Wet Prep HPF POC NONE SEEN NONE SEEN Final   Trich, Wet Prep NONE SEEN NONE SEEN Final   Clue Cells Wet Prep HPF POC NONE SEEN NONE SEEN Final   WBC, Wet Prep HPF POC TOO NUMEROUS TO COUNT (A) NONE SEEN Final   Sperm NONE SEEN  Final    Comment: Specimen diluted due to transport tube containing more than 1 ml of saline, interpret results with caution.  [place] rt PCR ([place] only)     Status: Abnormal   Collection Time: 05/11/17  5:09 PM  Result Value Ref Range Status   Specimen source GC/Chlam ENDOCERVICAL  Final   Chlamydia Tr DETECTED (A) NOT DETECTED Final   N gonorrhoeae NOT DETECTED NOT DETECTED Final    Comment: (NOTE) 100  This methodology has not been evaluated in pregnant women or in 200   patients with a history of hysterectomy. 300 400  This methodology will not be performed on patients less than 38  years of age.     Medical History: Past Medical History:  Diagnosis Date  . Anemia   . Heart murmur    s/p ECHO during this pregnancy with EF >55%     Assessment: 26 yo female recently diagnosed with STD. Pharmacy consulted to review ED Culture results. Patients UCx showing >100,000 STAPHYLOCOCCUS EPIDERMIDIS. Urinalysis showed NO bacteria, and NO leukocytes, but did show 6-30 WBC.   Patient did not report any urinary frequency or urgency during ED visit. Patient was contacted on 9/12 and continues to deny any symptoms of pain or urinary frequency or urgency.    Plan:  After discussion with Dr. Ola Spurr and Dr. Burlene Arnt no antibiotics will be prescribed for this patient. Dr. Ola Spurr believes this is not an infection and patient dose not need any additional abx outside of STD treatment. Patient was contacted 9/12 and she denies having any symptoms. She has been instructed to follow up with PCP if symptoms of UTI develop.   Tyrik Stetzer M Hennessy Bartel 05/15/2017,4:57 PM

## 2017-06-22 ENCOUNTER — Emergency Department
Admission: EM | Admit: 2017-06-22 | Discharge: 2017-06-22 | Disposition: A | Discharge status: Home / Self Care | Payer: Medicaid Other

## 2017-06-22 NOTE — ED Notes (Signed)
No answer when called for triage 

## 2017-06-22 NOTE — ED Notes (Signed)
No answer when called for room 

## 2018-07-01 ENCOUNTER — Other Ambulatory Visit: Payer: Self-pay

## 2018-07-01 ENCOUNTER — Emergency Department
Admission: EM | Admit: 2018-07-01 | Discharge: 2018-07-01 | Disposition: A | Discharge status: Home / Self Care | Payer: Medicaid Other | Attending: Emergency Medicine | Admitting: Emergency Medicine

## 2018-07-01 ENCOUNTER — Encounter: Payer: Self-pay | Admitting: Emergency Medicine

## 2018-07-01 DIAGNOSIS — B9789 Other viral agents as the cause of diseases classified elsewhere: Secondary | ICD-10-CM

## 2018-07-01 DIAGNOSIS — Z79899 Other long term (current) drug therapy: Secondary | ICD-10-CM | POA: Insufficient documentation

## 2018-07-01 DIAGNOSIS — F17290 Nicotine dependence, other tobacco product, uncomplicated: Secondary | ICD-10-CM | POA: Insufficient documentation

## 2018-07-01 DIAGNOSIS — J069 Acute upper respiratory infection, unspecified: Secondary | ICD-10-CM | POA: Insufficient documentation

## 2018-07-01 DIAGNOSIS — J029 Acute pharyngitis, unspecified: Secondary | ICD-10-CM | POA: Insufficient documentation

## 2018-07-01 LAB — GROUP A STREP BY PCR: Group A Strep by PCR: NOT DETECTED

## 2018-07-01 MED ORDER — BENZONATATE 100 MG PO CAPS: 200.0000 mg | ORAL_CAPSULE | Freq: Three times a day (TID) | ORAL | 0 refills | Status: AC | PRN

## 2018-07-01 MED ORDER — FEXOFENADINE-PSEUDOEPHED ER 60-120 MG PO TB12
1.0000 | ORAL_TABLET | Freq: Two times a day (BID) | ORAL | 0 refills | Status: DC
Start: 2018-07-01 — End: 2019-11-10

## 2018-07-01 NOTE — ED Triage Notes (Signed)
Dry cough at night, sore throat. NAD.

## 2018-07-01 NOTE — Discharge Instructions (Signed)
Your strep test was negative.  Follow discharge care instruction take medications as directed.

## 2018-07-01 NOTE — ED Provider Notes (Signed)
[facility] Emergency Department Provider Note   ____________________________________________   First MD Initiated Contact with Patient 07/01/18 1116     (approximate)  I have reviewed the triage vital signs and the nursing notes.   HISTORY  Chief Complaint Cough and Sore Throat    HPI Courtney Gordon is a 27 y.o. female patient presents for 1 week of a nonproductive cough and sore throat.  Patient denies fever chills associated with this complaint.  Patient denies nausea, vomiting, diarrhea.  Patient rates the pain describes a 5/10.  No palliative measure for complaint.  Past Medical History:  Diagnosis Date  . Anemia   . Heart murmur    s/p ECHO during this pregnancy with EF >55%    Patient Active Problem List   Diagnosis Date Noted  . Acute blood loss anemia 03/01/2016  . Placental abruption in third trimester 02/28/2016  . Postpartum care following cesarean delivery 02/28/2016  . Maternal mitral valve prolapse affecting pregnancy in third trimester, antepartum 02/28/2016  . Anemia affecting pregnancy in third trimester, antepartum 02/28/2016  . Status post cesarean section 02/28/2016    Past Surgical History:  Procedure Laterality Date  . CESAREAN SECTION N/A 02/28/2016   Procedure: CESAREAN SECTION;  Surgeon: Will Bonnet, MD;  Location: [facility] ORS;  Service: Obstetrics;  Laterality: N/A;  . TONSILLECTOMY      Prior to Admission medications   Medication Sig Start Date End Date Taking? Authorizing Provider  benzonatate (TESSALON PERLES) 100 MG capsule Take 2 capsules (200 mg total) by mouth 3 (three) times daily as needed. 07/01/18 07/01/19  Sable Feil, PA-C  cyclobenzaprine (FLEXERIL) 5 MG tablet Take 1 tablet (5 mg total) by mouth 3 (three) times daily as needed for muscle spasms. 08/06/16   Menshew, Dannielle Karvonen, PA-C  ferrous fumarate (HEMOCYTE - 106 MG FE) 325 (106 Fe) MG TABS tablet Take 1 tablet (106 mg of iron total) by  mouth daily. 03/03/16   Dalia Heading, CNM  fexofenadine-pseudoephedrine (ALLEGRA-D) 60-120 MG 12 hr tablet Take 1 tablet by mouth 2 (two) times daily. 07/01/18   Sable Feil, PA-C  HYDROcodone-acetaminophen (NORCO) 5-325 MG tablet Take 1 tablet by mouth 3 (three) times daily as needed. 08/06/16   Menshew, Dannielle Karvonen, PA-C  ibuprofen (ADVIL,MOTRIN) 600 MG tablet Take 1 tablet (600 mg total) by mouth every 6 (six) hours as needed for mild pain, moderate pain or cramping. 03/03/16   Dalia Heading, CNM  naproxen (EC NAPROSYN) 500 MG EC tablet Take 1 tablet (500 mg total) by mouth 2 (two) times daily with a meal. 08/06/16   Menshew, Dannielle Karvonen, PA-C  oxyCODONE-acetaminophen (PERCOCET/ROXICET) 5-325 MG tablet Take 1 tablet by mouth every 4 (four) hours as needed for moderate pain or severe pain (pain score 7-10). 03/03/16   Dalia Heading, CNM  Prenatal Vit-Fe Fumarate-FA (MULTIVITAMIN-PRENATAL) 27-0.8 MG TABS tablet Take 1 tablet by mouth daily at 12 noon.    [provider]  Prenatal Vit-Fe Fumarate-FA (PRENATAL MULTIVITAMIN) TABS tablet Take 1 tablet by mouth daily at 12 noon. 03/03/16   Dalia Heading, CNM    Allergies Patient has no known allergies.  Family History  Problem Relation Age of Onset  . Hypertension Maternal Grandmother     Social History Social History   Tobacco Use  . Smoking status: Current Every Day Smoker    Packs/day: 0.00    Types: Cigars  . Smokeless tobacco: Never Used  Substance Use Topics  .  Alcohol use: Yes    Comment: occas  . Drug use: No    Review of Systems  Constitutional: No fever/chills Eyes: No visual changes. ENT: Sore throat Cardiovascular: Denies chest pain. Respiratory: Denies shortness of breath.  Nonproductive cough. Gastrointestinal: No abdominal pain.  No nausea, no vomiting.  No diarrhea.  No constipation. Genitourinary: Negative for dysuria. Musculoskeletal: Negative for back pain. Skin: Negative for  rash. Neurological: Negative for headaches, focal weakness or numbness. Hematological/Lymphatic: Anemia Allergic/Immunilogical:   ____________________________________________   PHYSICAL EXAM:  VITAL SIGNS: ED Triage Vitals  Enc Vitals Group     BP 07/01/18 1046 (!) 99/47     Pulse Rate 07/01/18 1046 92     Resp --      Temp 07/01/18 1046 98.2 F (36.8 C)     Temp Source 07/01/18 1046 Oral     SpO2 07/01/18 1046 100 %     Weight 07/01/18 1049 120 lb (54.4 kg)     Height 07/01/18 1049 5\' 6"  (1.676 m)     Head Circumference --      Peak Flow --      Pain Score 07/01/18 1049 5     Pain Loc --      Pain Edu? --      Excl. in [place]? --     Constitutional: Alert and oriented. Well appearing and in no acute distress. Nose: Edematous nasal turbinates clear rhinorrhea.. Mouth/Throat: Mucous membranes are moist.  Oropharynx non-erythematous.  Postnasal drainage. Neck: No stridor.   Hematological/Lymphatic/Immunilogical No cervical lymphadenopathy. Cardiovascular: Normal rate, regular rhythm. Grossly normal heart sounds.  Good peripheral circulation. Respiratory: Normal respiratory effort.  No retractions. Lungs CTAB. Gastrointestinal: Soft and nontender. No distention. No abdominal bruits. No CVA tenderness. Musculoskeletal: No lower extremity tenderness nor edema.  No joint effusions. Neurologic:  Normal speech and language. No gross focal neurologic deficits are appreciated. No gait instability. Skin:  Skin is warm, dry and intact. No rash noted. Psychiatric: Mood and affect are normal. Speech and behavior are normal.  ____________________________________________   LABS (all labs ordered are listed, but only abnormal results are displayed)  Labs Reviewed  GROUP A STREP BY PCR   ____________________________________________  EKG   ____________________________________________  RADIOLOGY  ED MD interpretation:    Official radiology report(s): No results  found.  ____________________________________________   PROCEDURES  Procedure(s) performed: None  Procedures  Critical Care performed: No  ____________________________________________   INITIAL IMPRESSION / ASSESSMENT AND PLAN / ED COURSE  As part of my medical decision making, I reviewed the following data within the electronic MEDICAL RECORD NUMBER    Viral pharyngitis and upper story infection.  Discussed negative strep results with patient.  Patient given discharge care instruction advised take medication as directed.  Patient advised follow-up PCP as needed.      ____________________________________________   FINAL CLINICAL IMPRESSION(S) / ED DIAGNOSES  Final diagnoses:  Sore throat  Viral URI with cough     ED Discharge Orders         Ordered    benzonatate (TESSALON PERLES) 100 MG capsule  3 times daily PRN     07/01/18 1254    fexofenadine-pseudoephedrine (ALLEGRA-D) 60-120 MG 12 hr tablet  2 times daily     07/01/18 1254           Note:  This document was prepared using Dragon voice recognition software and may include unintentional dictation errors.    Sable Feil, PA-C 07/01/18 [place],  Anne-Caroline, MD 07/01/18 1521

## 2018-08-24 ENCOUNTER — Emergency Department: Payer: Self-pay

## 2018-08-24 ENCOUNTER — Emergency Department
Admission: EM | Admit: 2018-08-24 | Discharge: 2018-08-24 | Disposition: A | Discharge status: Home / Self Care | Payer: Self-pay | Attending: Emergency Medicine | Admitting: Emergency Medicine

## 2018-08-24 ENCOUNTER — Other Ambulatory Visit: Payer: Self-pay

## 2018-08-24 DIAGNOSIS — T1490XA Injury, unspecified, initial encounter: Secondary | ICD-10-CM

## 2018-08-24 DIAGNOSIS — T07XXXA Unspecified multiple injuries, initial encounter: Secondary | ICD-10-CM

## 2018-08-24 DIAGNOSIS — Y998 Other external cause status: Secondary | ICD-10-CM | POA: Insufficient documentation

## 2018-08-24 DIAGNOSIS — F17290 Nicotine dependence, other tobacco product, uncomplicated: Secondary | ICD-10-CM | POA: Insufficient documentation

## 2018-08-24 DIAGNOSIS — M7918 Myalgia, other site: Secondary | ICD-10-CM

## 2018-08-24 DIAGNOSIS — Y9389 Activity, other specified: Secondary | ICD-10-CM | POA: Insufficient documentation

## 2018-08-24 DIAGNOSIS — Y929 Unspecified place or not applicable: Secondary | ICD-10-CM | POA: Insufficient documentation

## 2018-08-24 NOTE — ED Notes (Signed)
Patient transported to X-ray 
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
## 2018-08-24 NOTE — ED Notes (Signed)
Report to jessica, rn
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
## 2018-08-24 NOTE — Discharge Instructions (Addendum)
We believe that your symptoms are caused by musculoskeletal strain and contusions.  Please read through the included information about additional care such as heating pads, over-the-counter pain medicine.  If you were provided a prescription please use it only as needed and as instructed.  Remember that early mobility and using the affected part of your body is actually better than keeping it immobile.  Follow-up with the doctor listed as recommended or return to the emergency department with new or worsening symptoms that concern you.
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
## 2019-01-09 ENCOUNTER — Emergency Department: Payer: Self-pay

## 2019-01-09 ENCOUNTER — Other Ambulatory Visit: Payer: Self-pay

## 2019-01-09 ENCOUNTER — Encounter: Payer: Self-pay | Admitting: Intensive Care

## 2019-01-09 ENCOUNTER — Emergency Department
Admission: EM | Admit: 2019-01-09 | Discharge: 2019-01-09 | Disposition: A | Discharge status: Home / Self Care | Payer: Self-pay | Attending: Emergency Medicine | Admitting: Emergency Medicine

## 2019-01-09 DIAGNOSIS — F17210 Nicotine dependence, cigarettes, uncomplicated: Secondary | ICD-10-CM | POA: Insufficient documentation

## 2019-01-09 DIAGNOSIS — Z79899 Other long term (current) drug therapy: Secondary | ICD-10-CM | POA: Insufficient documentation

## 2019-01-09 DIAGNOSIS — R102 Pelvic and perineal pain: Secondary | ICD-10-CM | POA: Insufficient documentation

## 2019-01-09 LAB — URINALYSIS, COMPLETE (UACMP) WITH MICROSCOPIC
Bilirubin Urine: NEGATIVE
Glucose, UA: NEGATIVE mg/dL
Ketones, ur: NEGATIVE mg/dL
Leukocytes,Ua: NEGATIVE
Nitrite: NEGATIVE
Protein, ur: NEGATIVE mg/dL
Specific Gravity, Urine: 1.003 — ABNORMAL LOW (ref 1.005–1.030)
pH: 6 (ref 5.0–8.0)

## 2019-01-09 LAB — POCT PREGNANCY, URINE: Preg Test, Ur: NEGATIVE

## 2019-01-09 MED ORDER — IBUPROFEN 800 MG PO TABS
800.0000 mg | ORAL_TABLET | Freq: Once | ORAL | Status: AC
  Administered 2019-01-09: 800 mg via ORAL
  Filled 2019-01-09: qty 1

## 2019-01-09 NOTE — ED Triage Notes (Signed)
C/o back pain and pelvic pain X2 days with blood clots that started today. Patient reports she was suppose to start her period on 5/1. Reports irregular periods. Unsure if she is pregnant or not

## 2019-01-09 NOTE — ED Notes (Signed)
Pt in ultrasound

## 2019-01-09 NOTE — ED Provider Notes (Addendum)
[facility] Emergency Department Provider Note ____________________________________________  Time seen: 44  I have reviewed the triage vital signs and the nursing notes.  HISTORY  Chief Complaint  Back Pain and Pelvic Pain  HPI Courtney Gordon is a 28 y.o. female presents herself to the ED for evaluation of bilateral low back pain and some pelvic pain and cramping.  Patient reports a normal menses last month, and was expecting her menses for this month about 5 days prior.  She describes the onset this morning of deep low back pain.  She noted today the passage of large dark clots, but describes only scant, intermittent vaginal bleeding.  Every symptoms including frequency, urgency, or retention.  She also denies any nausea, vomiting, fever, chills, sweats.  Patient presents to the ED for evaluation of back pain and pelvic pain with passage of large clots.  She denies any vaginal discharge, and reports any time she felt back pain was when she was in labor.  Past Medical History:  Diagnosis Date  . Anemia   . Heart murmur    s/p ECHO during this pregnancy with EF >55%    Patient Active Problem List   Diagnosis Date Noted  . Acute blood loss anemia 03/01/2016  . Placental abruption in third trimester 02/28/2016  . Postpartum care following cesarean delivery 02/28/2016  . Maternal mitral valve prolapse affecting pregnancy in third trimester, antepartum 02/28/2016  . Anemia affecting pregnancy in third trimester, antepartum 02/28/2016  . Status post cesarean section 02/28/2016    Past Surgical History:  Procedure Laterality Date  . CESAREAN SECTION N/A 02/28/2016   Procedure: CESAREAN SECTION;  Surgeon: Will Bonnet, MD;  Location: [facility] ORS;  Service: Obstetrics;  Laterality: N/A;  . TONSILLECTOMY      Prior to Admission medications   Medication Sig Start Date End Date Taking? Authorizing Provider  benzonatate (TESSALON PERLES) 100 MG capsule Take 2  capsules (200 mg total) by mouth 3 (three) times daily as needed. 07/01/18 07/01/19  Sable Feil, PA-C  cyclobenzaprine (FLEXERIL) 5 MG tablet Take 1 tablet (5 mg total) by mouth 3 (three) times daily as needed for muscle spasms. 08/06/16   Christl Fessenden, Dannielle Karvonen, PA-C  ferrous fumarate (HEMOCYTE - 106 MG FE) 325 (106 Fe) MG TABS tablet Take 1 tablet (106 mg of iron total) by mouth daily. 03/03/16   Dalia Heading, CNM  fexofenadine-pseudoephedrine (ALLEGRA-D) 60-120 MG 12 hr tablet Take 1 tablet by mouth 2 (two) times daily. 07/01/18   Sable Feil, PA-C  HYDROcodone-acetaminophen (NORCO) 5-325 MG tablet Take 1 tablet by mouth 3 (three) times daily as needed. 08/06/16   Lenoard Helbert, Dannielle Karvonen, PA-C  ibuprofen (ADVIL,MOTRIN) 600 MG tablet Take 1 tablet (600 mg total) by mouth every 6 (six) hours as needed for mild pain, moderate pain or cramping. 03/03/16   Dalia Heading, CNM  naproxen (EC NAPROSYN) 500 MG EC tablet Take 1 tablet (500 mg total) by mouth 2 (two) times daily with a meal. 08/06/16   Joslynne Klatt, Dannielle Karvonen, PA-C  oxyCODONE-acetaminophen (PERCOCET/ROXICET) 5-325 MG tablet Take 1 tablet by mouth every 4 (four) hours as needed for moderate pain or severe pain (pain score 7-10). 03/03/16   Dalia Heading, CNM  Prenatal Vit-Fe Fumarate-FA (MULTIVITAMIN-PRENATAL) 27-0.8 MG TABS tablet Take 1 tablet by mouth daily at 12 noon.    [provider]  Prenatal Vit-Fe Fumarate-FA (PRENATAL MULTIVITAMIN) TABS tablet Take 1 tablet by mouth daily at 12 noon. 03/03/16  Dalia Heading, CNM    Allergies Patient has no known allergies.  Family History  Problem Relation Age of Onset  . Hypertension Maternal Grandmother     Social History Social History   Tobacco Use  . Smoking status: Current Every Day Smoker    Packs/day: 0.00    Types: Cigars, Cigarettes  . Smokeless tobacco: Never Used  Substance Use Topics  . Alcohol use: Yes    Comment: occas  . Drug use: Yes     Types: Marijuana    Review of Systems  Constitutional: Negative for fever. Eyes: Negative for visual changes. ENT: Negative for sore throat. Cardiovascular: Negative for chest pain. Respiratory: Negative for shortness of breath. Gastrointestinal: Negative for abdominal pain, vomiting and diarrhea. Genitourinary: Negative for dysuria. Musculoskeletal: Negative for back pain. Skin: Negative for rash. Neurological: Negative for headaches, focal weakness or numbness. ____________________________________________  PHYSICAL EXAM:  VITAL SIGNS: ED Triage Vitals  Enc Vitals Group     BP 01/09/19 1739 120/64     Pulse Rate 01/09/19 1736 73     Resp 01/09/19 1736 16     Temp 01/09/19 1736 98.1 F (36.7 C)     Temp Source 01/09/19 1736 Oral     SpO2 01/09/19 1736 100 %     Weight 01/09/19 1738 127 lb (57.6 kg)     Height 01/09/19 1738 5\' 6"  (1.676 m)     Head Circumference --      Peak Flow --      Pain Score 01/09/19 1738 10     Pain Loc --      Pain Edu? --      Excl. in [place]? --     Constitutional: Alert and oriented. Well appearing and in no distress. Head: Normocephalic and atraumatic. Eyes: Conjunctivae are normal. PERRL. Normal extraocular movements Ears: Canals clear. TMs intact bilaterally. Nose: No congestion/rhinorrhea/epistaxis. Mouth/Throat: Mucous membranes are moist. Neck: Supple. No thyromegaly. Hematological/Lymphatic/Immunological: No cervical lymphadenopathy. Cardiovascular: Normal rate, regular rhythm. Normal distal pulses. Respiratory: Normal respiratory effort. No wheezes/rales/rhonchi. Gastrointestinal: Soft and nontender. No distention. Musculoskeletal: Nontender with normal range of motion in all extremities.  Neurologic:  Normal gait without ataxia. Normal speech and language. No gross focal neurologic deficits are appreciated. Skin:  Skin is warm, dry and intact. No rash noted. Psychiatric: Mood and affect are normal. Patient exhibits appropriate  insight and judgment. ____________________________________________   LABS (pertinent positives/negatives) Labs Reviewed  URINALYSIS, COMPLETE (UACMP) WITH MICROSCOPIC - Abnormal; Notable for the following components:      Result Value   Color, Urine STRAW (*)    APPearance CLEAR (*)    Specific Gravity, Urine 1.003 (*)    Hgb urine dipstick LARGE (*)    Bacteria, UA RARE (*)    All other components within normal limits  POC URINE PREG, ED  POCT PREGNANCY, URINE  ___________________________________________   RADIOLOGY  US Pelvic w/ Transvaginal  IMPRESSION: 1. 2.1 cm simple left ovarian cyst, most compatible with a normal physiologic follicular cyst/dominant follicle. Associated small volume free physiologic fluid within the pelvis. 2. Thickened endometrial stripe measuring up to 18.6 mm in thickness. If bleeding remains unresponsive to hormonal or medical therapy, focal lesion work-up with sonohysterogram should be considered. Endometrial biopsy should also be considered in pre-menopausal patients at high risk for endometrial carcinoma. (Ref: Radiological Reasoning: Algorithmic Workup of Abnormal Vaginal Bleeding with Endovaginal Sonography and Sonohysterography. AJR 2008; 694:W54-62). 3. No other acute abnormality within the pelvis. No evidence for torsion. ____________________________________________  PROCEDURES  Procedures IBU 800 mg PO ____________________________________________  INITIAL IMPRESSION / ASSESSMENT AND PLAN / ED COURSE  Courtney Gordon was evaluated in Emergency Department on 01/09/2019 for the symptoms described in the history of present illness. She was evaluated in the context of the global COVID-19 pandemic, which necessitated consideration that the patient might be at risk for infection with the SARS-CoV-2 virus that causes COVID-19. Institutional protocols and algorithms that pertain to the evaluation of patients at risk for COVID-19 are in a  state of rapid change based on information released by regulatory bodies including the CDC and federal and state organizations. These policies and algorithms were followed during the patient's care in the ED.  Patient is 28 year old female with complaints of low back pain and pelvic pain for the last 2 days.  She began to pass large clots vaginally this morning.  ----------------------------------------- 8:23 PM on 01/09/2019 -----------------------------------------  Patient with a benign exam with normal labs given her complaint. She denies any current pain, and is resting comfortably in the room. Her [place] is reassuring, showing a physiological left ovarian cyst and endometrial stipe of > 18 mm. No acute findings of ectopic/tubal pregnancy, torsion, or hemorrhagic cyst. She will take OTC ibuprofen for pain, and follow-up with her GYN provider.   ____________________________________________  FINAL CLINICAL IMPRESSION(S) / ED DIAGNOSES  Final diagnoses:  Pelvic pain      Rakayla Ricklefs, Dannielle Karvonen, PA-C 01/09/19 2030    Daeshaun Specht, Dannielle Karvonen, PA-C 01/09/19 2033    Schuyler Amor, MD 01/09/19 2235

## 2019-01-09 NOTE — Discharge Instructions (Signed)
Your exam, labs, and [place] are reassuring at this time. You may expect to have a normal period. Take OTC ibuprofen as needed. Follow-up with [facility] for ongoing symptoms.

## 2019-01-14 ENCOUNTER — Telehealth: Payer: Self-pay | Admitting: Obstetrics and Gynecology

## 2019-01-14 NOTE — Telephone Encounter (Signed)
Patient is calling due to having heavy bleeding and is requesting someone to please contact. Patient was seen in ER on 01/09/19. Is schedule for 01/16/19 with AMS. Patient is concern about her her blood loss and wants to know if she needs to back to the ER or if she should wait for her Friday appointment . Please advise

## 2019-01-14 NOTE — Telephone Encounter (Signed)
Please advise 

## 2019-01-14 NOTE — Telephone Encounter (Signed)
Patient aware of message per AMS

## 2019-01-14 NOTE — Telephone Encounter (Signed)
Per AMS, he said if it has gotten worse, she needs to be seen in ER. She is not an established patient here at [facility]. Otherwise she can wait until Friday.   I tried to call pt, unable to reach by phone, d/t voicemail not set up. I will send her a Mychart msg.

## 2019-01-16 ENCOUNTER — Encounter: Payer: Self-pay | Admitting: Obstetrics and Gynecology

## 2019-10-09 ENCOUNTER — Emergency Department
Admission: EM | Admit: 2019-10-09 | Discharge: 2019-10-09 | Disposition: A | Discharge status: Home / Self Care | Payer: Self-pay | Attending: Emergency Medicine | Admitting: Emergency Medicine

## 2019-10-09 ENCOUNTER — Other Ambulatory Visit: Payer: Self-pay

## 2019-10-09 ENCOUNTER — Encounter: Payer: Self-pay | Admitting: Emergency Medicine

## 2019-10-09 DIAGNOSIS — F17210 Nicotine dependence, cigarettes, uncomplicated: Secondary | ICD-10-CM | POA: Insufficient documentation

## 2019-10-09 DIAGNOSIS — Z3202 Encounter for pregnancy test, result negative: Secondary | ICD-10-CM | POA: Insufficient documentation

## 2019-10-09 DIAGNOSIS — Z79899 Other long term (current) drug therapy: Secondary | ICD-10-CM | POA: Insufficient documentation

## 2019-10-09 LAB — URINALYSIS, COMPLETE (UACMP) WITH MICROSCOPIC
Bacteria, UA: NONE SEEN
Bilirubin Urine: NEGATIVE
Glucose, UA: NEGATIVE mg/dL
Hgb urine dipstick: NEGATIVE
Ketones, ur: NEGATIVE mg/dL
Nitrite: NEGATIVE
Protein, ur: NEGATIVE mg/dL
Specific Gravity, Urine: 1.024 (ref 1.005–1.030)
pH: 6 (ref 5.0–8.0)

## 2019-10-09 LAB — POCT PREGNANCY, URINE: Preg Test, Ur: NEGATIVE

## 2019-10-09 LAB — HCG, QUANTITATIVE, PREGNANCY: hCG, Beta Chain, Quant, S: <1 m[IU]/mL (ref <5)

## 2019-10-09 NOTE — ED Notes (Signed)
Pt states that urine preg tests do not result and that she would like to talk with provider about a blood test.

## 2019-10-09 NOTE — Discharge Instructions (Addendum)
Your result is NEGATIVE. Follow-up with [facility] or OBG for continued care.

## 2019-10-09 NOTE — ED Notes (Signed)
See triage note. Pt states her LMP began on 2 January and she is several days late. Pt claims abnormal cramping and intermittent spotting beginning on the 26th. Pt is not currently taking any form of birth control. Pt is currently sexually active.

## 2019-10-09 NOTE — ED Triage Notes (Signed)
Pt arrives ambulatory to triage for a pregnancy test. Pt denies pain or any other reason for visit. Pt is in NAD.

## 2019-11-10 NOTE — ED Provider Notes (Signed)
[facility] Emergency Department Provider Note ____________________________________________  Time seen: 6  I have reviewed the triage vital signs and the nursing notes.  HISTORY  Chief Complaint  Wants pregnancy test  HPI Courtney Gordon is a 29 y.o. female presents to the ED evaluation of concern for possible pregnancy. She reports her LMP was 09/08/19. She denies any pelvic pain, vaginal discharge or abnormal bleeding. She reports she just "feels like" she "normally feels when she is pregnant."  Past Medical History:  Diagnosis Date  . Anemia   . Heart murmur    s/p ECHO during this pregnancy with EF >55%    Patient Active Problem List   Diagnosis Date Noted  . Acute blood loss anemia 03/01/2016  . Placental abruption in third trimester 02/28/2016  . Postpartum care following cesarean delivery 02/28/2016  . Maternal mitral valve prolapse affecting pregnancy in third trimester, antepartum 02/28/2016  . Anemia affecting pregnancy in third trimester, antepartum 02/28/2016  . Status post cesarean section 02/28/2016    Past Surgical History:  Procedure Laterality Date  . CESAREAN SECTION N/A 02/28/2016   Procedure: CESAREAN SECTION;  Surgeon: Will Bonnet, MD;  Location: [facility] ORS;  Service: Obstetrics;  Laterality: N/A;  . TONSILLECTOMY      Prior to Admission medications   Medication Sig Start Date End Date Taking? Authorizing Provider  cyclobenzaprine (FLEXERIL) 5 MG tablet Take 1 tablet (5 mg total) by mouth 3 (three) times daily as needed for muscle spasms. 08/06/16   Kolton Kienle, Dannielle Karvonen, PA-C  ferrous fumarate (HEMOCYTE - 106 MG FE) 325 (106 Fe) MG TABS tablet Take 1 tablet (106 mg of iron total) by mouth daily. 03/03/16   Dalia Heading, CNM  fexofenadine-pseudoephedrine (ALLEGRA-D) 60-120 MG 12 hr tablet Take 1 tablet by mouth 2 (two) times daily. 07/01/18   Sable Feil, PA-C  HYDROcodone-acetaminophen (NORCO) 5-325 MG tablet Take  1 tablet by mouth 3 (three) times daily as needed. 08/06/16   Hetty Linhart, Dannielle Karvonen, PA-C  ibuprofen (ADVIL,MOTRIN) 600 MG tablet Take 1 tablet (600 mg total) by mouth every 6 (six) hours as needed for mild pain, moderate pain or cramping. 03/03/16   Dalia Heading, CNM  naproxen (EC NAPROSYN) 500 MG EC tablet Take 1 tablet (500 mg total) by mouth 2 (two) times daily with a meal. 08/06/16   Shyvonne Chastang, Dannielle Karvonen, PA-C  oxyCODONE-acetaminophen (PERCOCET/ROXICET) 5-325 MG tablet Take 1 tablet by mouth every 4 (four) hours as needed for moderate pain or severe pain (pain score 7-10). 03/03/16   Dalia Heading, CNM  Prenatal Vit-Fe Fumarate-FA (MULTIVITAMIN-PRENATAL) 27-0.8 MG TABS tablet Take 1 tablet by mouth daily at 12 noon.    [provider]  Prenatal Vit-Fe Fumarate-FA (PRENATAL MULTIVITAMIN) TABS tablet Take 1 tablet by mouth daily at 12 noon. 03/03/16   Dalia Heading, CNM    Allergies Patient has no known allergies.  Family History  Problem Relation Age of Onset  . Hypertension Maternal Grandmother     Social History Social History   Tobacco Use  . Smoking status: Current Every Day Smoker    Packs/day: 0.00    Types: Cigars, Cigarettes  . Smokeless tobacco: Never Used  Substance Use Topics  . Alcohol use: Yes    Comment: occas  . Drug use: Yes    Types: Marijuana    Review of Systems  Constitutional: Negative for fever. Cardiovascular: Negative for chest pain. Respiratory: Negative for shortness of breath. Gastrointestinal: Negative for abdominal pain,  vomiting and diarrhea. Genitourinary: Negative for dysuria. Musculoskeletal: Negative for back pain. Skin: Negative for rash. Neurological: Negative for headaches, focal weakness or numbness. ____________________________________________  PHYSICAL EXAM:  VITAL SIGNS: ED Triage Vitals  Enc Vitals Group     BP 10/09/19 1924 122/80     Pulse Rate 10/09/19 1924 62     Resp 10/09/19 1924 17     Temp  10/09/19 1924 98.5 F (36.9 C)     Temp Source 10/09/19 1924 Oral     SpO2 10/09/19 1924 100 %     Weight 10/09/19 1921 130 lb (59 kg)     Height 10/09/19 1921 5\' 6"  (1.676 m)     Head Circumference --      Peak Flow --      Pain Score 10/09/19 1921 0     Pain Loc --      Pain Edu? --      Excl. in [place]? --     Constitutional: Alert and oriented. Well appearing and in no distress. Head: Normocephalic and atraumatic. Eyes: Conjunctivae are normal. Normal extraocular movements Cardiovascular: Normal rate, regular rhythm. Normal distal pulses. Respiratory: Normal respiratory effort. No wheezes/rales/rhonchi. GU: deferred Musculoskeletal: Nontender with normal range of motion in all extremities.  Neurologic:  Normal gait without ataxia. Normal speech and language. No gross focal neurologic deficits are appreciated. Skin:  Skin is warm, dry and intact. No rash noted. Psychiatric: Mood and affect are normal. Patient exhibits appropriate insight and judgment. ____________________________________________   LABS (pertinent positives/negatives) Labs Reviewed  URINALYSIS, COMPLETE (UACMP) WITH MICROSCOPIC - Abnormal; Notable for the following components:      Result Value   Color, Urine YELLOW (*)    APPearance HAZY (*)    Leukocytes,Ua TRACE (*)    All other components within normal limits  HCG, QUANTITATIVE, PREGNANCY  POCT PREGNANCY, URINE  ____________________________________________  PROCEDURES  Procedures ____________________________________________  INITIAL IMPRESSION / ASSESSMENT AND PLAN / ED COURSE  Patient with ED evaluation and request for pregnancy test.  Patient denies any missed periods, tender breast, or pelvic discomfort.  Her urine and blood test revealed a negative beta quant.  She is discharged at this time to follow-up with primary provider for ongoing symptoms see OB provider for any ongoing secondary amenorrhea.  Courtney Gordon was evaluated in Emergency  Department on 11/10/2019 for the symptoms described in the history of present illness. She was evaluated in the context of the global COVID-19 pandemic, which necessitated consideration that the patient might be at risk for infection with the SARS-CoV-2 virus that causes COVID-19. Institutional protocols and algorithms that pertain to the evaluation of patients at risk for COVID-19 are in a state of rapid change based on information released by regulatory bodies including the CDC and federal and state organizations. These policies and algorithms were followed during the patient's care in the ED. ____________________________________________  FINAL CLINICAL IMPRESSION(S) / ED DIAGNOSES  Final diagnoses:  Pregnancy examination or test, negative result      Melvenia Needles, PA-C 11/10/19 1523    Blake Divine, MD 11/10/19 [phone]

## 2019-12-18 ENCOUNTER — Ambulatory Visit (INDEPENDENT_AMBULATORY_CARE_PROVIDER_SITE_OTHER): Payer: Self-pay | Admitting: Obstetrics and Gynecology

## 2019-12-18 ENCOUNTER — Encounter: Payer: Self-pay | Admitting: Obstetrics and Gynecology

## 2019-12-18 ENCOUNTER — Other Ambulatory Visit: Payer: Self-pay

## 2019-12-18 ENCOUNTER — Other Ambulatory Visit (HOSPITAL_COMMUNITY)
Admission: RE | Admit: 2019-12-18 | Discharge: 2019-12-18 | Disposition: A | Discharge status: Home / Self Care | Payer: Self-pay | Source: Ambulatory Visit | Attending: Obstetrics and Gynecology | Admitting: Obstetrics and Gynecology

## 2019-12-18 VITALS — BP 116/68 | Ht 66.0 in | Wt 134.0 lb

## 2019-12-18 DIAGNOSIS — N921 Excessive and frequent menstruation with irregular cycle: Secondary | ICD-10-CM | POA: Insufficient documentation

## 2019-12-18 DIAGNOSIS — Z113 Encounter for screening for infections with a predominantly sexual mode of transmission: Secondary | ICD-10-CM

## 2019-12-18 DIAGNOSIS — N760 Acute vaginitis: Secondary | ICD-10-CM

## 2019-12-18 DIAGNOSIS — N939 Abnormal uterine and vaginal bleeding, unspecified: Secondary | ICD-10-CM

## 2019-12-18 NOTE — Progress Notes (Signed)
   Patient ID: Courtney Gordon, female   DOB: March 30, 1991, 29 y.o.   MRN: [id number]  Reason for Consult: Hospitalization Follow-up (irregular cycles)   Referred by [facility]  Subjective:     HPI:  Courtney Gordon is a 29 y.o. female. She is here to follow up recent hospitalization.  She reports irregular bleeding. She has some heavy cycles. She currently uses condoms. Her mom and grandmother have fibroids  Gynecological History  Patient's last menstrual period was 12/16/2019.   History of fibroids, polyps, or ovarian cysts? : no  History of PCOS? no Hstory of Endometriosis? no History of abnormal pap smears? no Have you had any sexually transmitted infections in the past? no   Past Medical History:  Diagnosis Date  . Anemia   . Heart murmur    s/p ECHO during this pregnancy with EF >55%   Family History  Problem Relation Age of Onset  . Hypertension Maternal Grandmother    Past Surgical History:  Procedure Laterality Date  . CESAREAN SECTION N/A 02/28/2016   Procedure: CESAREAN SECTION;  Surgeon: Will Bonnet, MD;  Location: [facility] ORS;  Service: Obstetrics;  Laterality: N/A;  . TONSILLECTOMY      Short Social History:  Social History   Tobacco Use  . Smoking status: Current Every Day Smoker    Packs/day: 0.00    Types: Cigars, Cigarettes  . Smokeless tobacco: Never Used  Substance Use Topics  . Alcohol use: Yes    Comment: occas    No Known Allergies  Current Outpatient Medications  Medication Sig Dispense Refill  . brompheniramine-pseudoephedrine-DM 30-2-10 MG/5ML syrup Take 5 mLs by mouth 4 (four) times daily as needed. Mix with 5 mL of viscous lidocaine for swish and swallow 120 mL 0  . ibuprofen (ADVIL) 600 MG tablet Take 1 tablet (600 mg total) by mouth every 8 (eight) hours as needed. 15 tablet 0  . Levonorgest-Eth Est & Eth Est (QUARTETTE) 42-21-21-7 DAYS TABS Take 1 each by mouth daily. 91 tablet 4  . lidocaine (XYLOCAINE) 2  % solution Use as directed 5 mLs in the mouth or throat every 6 (six) hours as needed for mouth pain. Mix with 5 mL of Bromfed-DM for swish and swallow. 100 mL 0   No current facility-administered medications for this visit.    REVIEW OF SYSTEMS      Objective:  Objective   Vitals:   12/18/19 1345  BP: 116/68  Weight: 134 lb (60.8 kg)  Height: 5\' 6"  (1.676 m)   Body mass index is 21.63 kg/m.  Physical Exam  Assessment/Plan:     29 yo with irregular and heavy menstrual bleeding Will check labs Pap smear and STD , pregnancy testing today Will follow up for a pelvic US  [name] MD [facility] 12/26/2020 3:54 PM

## 2019-12-18 NOTE — Patient Instructions (Signed)
Abnormal Uterine Bleeding °Abnormal uterine bleeding is unusual bleeding from the uterus. It includes: °· Bleeding or spotting between periods. °· Bleeding after sex. °· Bleeding that is heavier than normal. °· Periods that last longer than usual. °· Bleeding after menopause. °Abnormal uterine bleeding can affect women at various stages in life, including teenagers, women in their reproductive years, pregnant women, and women who have reached menopause. Common causes of abnormal uterine bleeding include: °· Pregnancy. °· Growths of tissue (polyps). °· A noncancerous tumor in the uterus (fibroid). °· Infection. °· Cancer. °· Hormonal imbalances. °Any type of abnormal bleeding should be evaluated by a health care provider. Many cases are minor and simple to treat, while others are more serious. Treatment will depend on the cause of the bleeding. °Follow these instructions at home: °· Monitor your condition for any changes. °· Do not use tampons, douche, or have sex if told by your health care provider. °· Change your pads often. °· Get regular exams that include pelvic exams and cervical cancer screening. °· Keep all follow-up visits as told by your health care provider. This is important. °Contact a health care provider if: °· Your bleeding lasts for more than one week. °· You feel dizzy at times. °· You feel nauseous or you vomit. °Get help right away if: °· You pass out. °· Your bleeding soaks through a pad every hour. °· You have abdominal pain. °· You have a fever. °· You become sweaty or weak. °· You pass large blood clots from your vagina. °Summary °· Abnormal uterine bleeding is unusual bleeding from the uterus. °· Any type of abnormal bleeding should be evaluated by a health care provider. Many cases are minor and simple to treat, while others are more serious. °· Treatment will depend on the cause of the bleeding. °This information is not intended to replace advice given to you by your health care provider.  Make sure you discuss any questions you have with your health care provider. °Document Revised: 11/27/2017 Document Reviewed: 09/21/2016 °Elsevier Patient Education © 2020 [employer] ° °

## 2019-12-21 LAB — NUSWAB VAGINITIS PLUS (VG+)
BVAB 2: HIGH Score — AB
Candida albicans, NAA: NEGATIVE
Candida glabrata, NAA: NEGATIVE
Chlamydia trachomatis, NAA: NEGATIVE
Megasphaera 1: HIGH Score — AB
Neisseria gonorrhoeae, NAA: NEGATIVE
Trich vag by NAA: POSITIVE — AB

## 2019-12-22 ENCOUNTER — Other Ambulatory Visit: Payer: Self-pay | Admitting: Obstetrics and Gynecology

## 2019-12-22 DIAGNOSIS — A599 Trichomoniasis, unspecified: Secondary | ICD-10-CM

## 2019-12-22 MED ORDER — METRONIDAZOLE 500 MG PO TABS: 2000.0000 mg | ORAL_TABLET | Freq: Once | ORAL | 1 refills | Status: AC

## 2019-12-22 NOTE — Progress Notes (Signed)
Called patient. Phone number was not working. Could not leave message. Does not have mychart set up. Will mail result.

## 2019-12-22 NOTE — Progress Notes (Signed)
Can you print this lab result and letter I wrote and mail it to the patient. I sent a prescription for flagyl to her pharmacy to treat the trichomonas infection she was positive for on the swab.   Thank you, Dr. Gilman Schmidt

## 2019-12-24 LAB — CYTOLOGY - PAP
Comment: NEGATIVE
Comment: NEGATIVE
Comment: NEGATIVE
Diagnosis: UNDETERMINED — AB
HPV 16: POSITIVE — AB
HPV 18 / 45: NEGATIVE
High risk HPV: POSITIVE — AB

## 2020-01-04 ENCOUNTER — Ambulatory Visit: Payer: Self-pay | Admitting: Obstetrics and Gynecology

## 2020-01-04 ENCOUNTER — Ambulatory Visit: Payer: Self-pay

## 2020-01-25 ENCOUNTER — Ambulatory Visit (INDEPENDENT_AMBULATORY_CARE_PROVIDER_SITE_OTHER): Payer: Self-pay

## 2020-01-25 ENCOUNTER — Other Ambulatory Visit: Payer: Self-pay

## 2020-01-25 ENCOUNTER — Other Ambulatory Visit: Payer: Self-pay | Admitting: Obstetrics and Gynecology

## 2020-01-25 ENCOUNTER — Encounter: Payer: Self-pay | Admitting: Obstetrics and Gynecology

## 2020-01-25 ENCOUNTER — Ambulatory Visit (INDEPENDENT_AMBULATORY_CARE_PROVIDER_SITE_OTHER): Payer: Self-pay | Admitting: Obstetrics and Gynecology

## 2020-01-25 VITALS — BP 110/70 | Ht 66.0 in | Wt 133.0 lb

## 2020-01-25 DIAGNOSIS — N921 Excessive and frequent menstruation with irregular cycle: Secondary | ICD-10-CM

## 2020-01-25 DIAGNOSIS — N760 Acute vaginitis: Secondary | ICD-10-CM

## 2020-01-25 DIAGNOSIS — B9689 Other specified bacterial agents as the cause of diseases classified elsewhere: Secondary | ICD-10-CM

## 2020-01-25 DIAGNOSIS — N939 Abnormal uterine and vaginal bleeding, unspecified: Secondary | ICD-10-CM

## 2020-01-25 DIAGNOSIS — Z30011 Encounter for initial prescription of contraceptive pills: Secondary | ICD-10-CM

## 2020-01-25 DIAGNOSIS — A599 Trichomoniasis, unspecified: Secondary | ICD-10-CM

## 2020-01-25 MED ORDER — LEVONORGEST-ETH EST & ETH EST 42-21-21-7 DAYS PO TABS: 1.0000 | ORAL_TABLET | Freq: Every day | ORAL | 4 refills | Status: DC

## 2020-01-25 MED ORDER — METRONIDAZOLE 500 MG PO TABS: 500.0000 mg | ORAL_TABLET | Freq: Two times a day (BID) | ORAL | 1 refills | Status: AC

## 2020-01-25 NOTE — Progress Notes (Signed)
Patient ID: Courtney Gordon, female   DOB: 26-Dec-1990, 29 y.o.   MRN: [id number]  Reason for Consult: Menstrual Problem   Referred by [facility]  Subjective:     HPI:  Courtney Gordon is a 29 y.o. female she is following up today for a pelvic ultrasound and discussion of her menorrhagia.  Patient reports that she last had a menstrual period on 01/03/2020.  She reports that the menstrual cycle was heavy which has been normal for her.  She was sent a letter regarding her bacterial vaginosis and trichomonas infections.  She reports that she did not receive that letter nor did she start taking her prescribed antibiotics.   Past Medical History:  Diagnosis Date  . Anemia   . Heart murmur    s/p ECHO during this pregnancy with EF >55%   Family History  Problem Relation Age of Onset  . Hypertension Maternal Grandmother    Past Surgical History:  Procedure Laterality Date  . CESAREAN SECTION N/A 02/28/2016   Procedure: CESAREAN SECTION;  Surgeon: Will Bonnet, MD;  Location: [facility] ORS;  Service: Obstetrics;  Laterality: N/A;  . TONSILLECTOMY      Short Social History:  Social History   Tobacco Use  . Smoking status: Current Every Day Smoker    Packs/day: 0.00    Types: Cigars, Cigarettes  . Smokeless tobacco: Never Used  Substance Use Topics  . Alcohol use: Yes    Comment: occas    No Known Allergies  Current Outpatient Medications  Medication Sig Dispense Refill  . Levonorgest-Eth Est & Eth Est (QUARTETTE) 42-21-21-7 DAYS TABS Take 1 each by mouth daily. 91 tablet 4  . metroNIDAZOLE (FLAGYL) 500 MG tablet Take 1 tablet (500 mg total) by mouth 2 (two) times daily for 7 days. 14 tablet 1   No current facility-administered medications for this visit.    Review of Systems  Constitutional: Negative for chills, fatigue, fever and unexpected weight change.  HENT: Negative for trouble swallowing.  Eyes: Negative for loss of vision.  Respiratory:  Negative for cough, shortness of breath and wheezing.  Cardiovascular: Negative for chest pain, leg swelling, palpitations and syncope.  GI: Negative for abdominal pain, blood in stool, diarrhea, nausea and vomiting.  GU: Negative for difficulty urinating, dysuria, frequency and hematuria.  Musculoskeletal: Negative for back pain, leg pain and joint pain.  Skin: Negative for rash.  Neurological: Negative for dizziness, headaches, light-headedness, numbness and seizures.  Psychiatric: Negative for behavioral problem, confusion, depressed mood and sleep disturbance.        Objective:  Objective   Vitals:   01/25/20 1341  BP: 110/70  Weight: 133 lb (60.3 kg)  Height: 5\' 6"  (1.676 m)   Body mass index is 21.47 kg/m.  Physical Exam Vitals and nursing note reviewed.  Constitutional:      Appearance: She is well-developed.  HENT:     Head: Normocephalic and atraumatic.  Eyes:     Pupils: Pupils are equal, round, and reactive to light.  Cardiovascular:     Rate and Rhythm: Normal rate and regular rhythm.  Pulmonary:     Effort: Pulmonary effort is normal. No respiratory distress.  Skin:    General: Skin is warm and dry.  Neurological:     Mental Status: She is alert and oriented to person, place, and time.  Psychiatric:        Behavior: Behavior normal.        Thought  Content: Thought content normal.        Judgment: Judgment normal.        Assessment/Plan:     29 year old with menorrhagia and acute vaginitis Discussed BV and trichomonas from last visit. Resent rx for patient and her sexual partner. Patient did not receive letter.  TOC for trichomonas in 1-3 months.  Will start on extended cycle OCP.  Discussed taking Motrin during cycle as well to help reduce amount of bleeding.   We will follow up with patient in 1 to 3 months.  More than 30 minutes were spent face to face with the patient in the room, reviewing the medical record, labs and images, and coordinating  care for the patient. The plan of management was discussed in detail and counseling was provided.    Adrian Prows MD [facility], [facility] 01/25/2020 3:02 PM

## 2020-01-25 NOTE — Patient Instructions (Signed)
Trichomonas: 2,000 mg once or 500 mg twice a day for 7 days Do not drink alcohol with this medication  Motrin 600 mg every 6 hours for 7 days during menstrual cycle   Oral Contraception Use Oral contraceptive pills (OCPs) are medicines that you take to prevent pregnancy. OCPs work by:  Preventing the ovaries from releasing eggs.  Thickening mucus in the lower part of the uterus (cervix), which prevents sperm from entering the uterus.  Thinning the lining of the uterus (endometrium), which prevents a fertilized egg from attaching to the endometrium. OCPs are highly effective when taken exactly as prescribed. However, OCPs do not prevent sexually transmitted infections (STIs). Safe sex practices, such as using condoms while on an OCP, can help prevent STIs. Before taking OCPs, you may have a physical exam, blood test, and Pap test. A Pap test involves taking a sample of cells from your cervix to check for cancer. Discuss with your health care provider the possible side effects of the OCP you may be prescribed. When you start an OCP, be aware that it can take 2-3 months for your body to adjust to changes in hormone levels. How to take oral contraceptive pills Follow instructions from your health care provider about how to start taking your first cycle of OCPs. Your health care provider may recommend that you:  Start the pill on day 1 of your menstrual period. If you start at this time, you will not need any backup form of birth control (contraception), such as condoms.  Start the pill on the first Sunday after your menstrual period or on the day you get your prescription. In these cases, you will need to use backup contraception for the first week.  Start the pill at any time of your cycle. ? If you take the pill within 5 days of the start of your period, you will not need a backup form of contraception. ? If you start at any other time of your menstrual cycle, you will need to use another form  of contraception for 7 days. If your OCP is the type called a minipill, it will protect you from pregnancy after taking it for 2 days (48 hours), and you can stop using backup contraception after that time. After you have started taking OCPs:  If you forget to take 1 pill, take it as soon as you remember. Take the next pill at the regular time.  If you miss 2 or more pills, call your health care provider. Different pills have different instructions for missed doses. Use backup birth control until your next menstrual period starts.  If you use a 28-day pack that contains inactive pills and you miss 1 of the last 7 pills (pills with no hormones), throw away the rest of the non-hormone pills and start a new pill pack. No matter which day you start the OCP, you will always start a new pack on that same day of the week. Have an extra pack of OCPs and a backup contraceptive method available in case you miss some pills or lose your OCP pack. Follow these instructions at home:  Do not use any products that contain nicotine or tobacco, such as cigarettes and e-cigarettes. If you need help quitting, ask your health care provider.  Always use a condom to protect against STIs. OCPs do not protect against STIs.  Use a calendar to mark the days of your menstrual period.  Read the information and directions that came with your OCP. Talk  to your health care provider if you have questions. Contact a health care provider if:  You develop nausea and vomiting.  You have abnormal vaginal discharge or bleeding.  You develop a rash.  You miss your menstrual period. Depending on the type of OCP you are taking, this may be a sign of pregnancy. Ask your health care provider for more information.  You are losing your hair.  You need treatment for mood swings or depression.  You get dizzy when taking the OCP.  You develop acne after taking the OCP.  You become pregnant or think you may be pregnant.  You  have diarrhea, constipation, and abdominal pain or cramps.  You miss 2 or more pills. Get help right away if:  You develop chest pain.  You develop shortness of breath.  You have an uncontrolled or severe headache.  You develop numbness or slurred speech.  You develop visual or speech problems.  You develop pain, redness, and swelling in your legs.  You develop weakness or numbness in your arms or legs. Summary  Oral contraceptive pills (OCPs) are medicines that you take to prevent pregnancy.  OCPs do not prevent sexually transmitted infections (STIs). Always use a condom to protect against STIs.  When you start an OCP, be aware that it can take 2-3 months for your body to adjust to changes in hormone levels.  Read all the information and directions that come with your OCP. This information is not intended to replace advice given to you by your health care provider. Make sure you discuss any questions you have with your health care provider. Document Revised: 12/12/2018 Document Reviewed: 10/01/2016 Elsevier Patient Education  [place].   Trichomoniasis Trichomoniasis is an STI (sexually transmitted infection) that can affect both women and men. In women, the outer area of the female genitalia (vulva) and the vagina are affected. In men, mainly the penis is affected, but the prostate and other reproductive organs can also be involved.  This condition can be treated with medicine. It often has no symptoms (is asymptomatic), especially in men. If not treated, trichomoniasis can last for months or years. What are the causes? This condition is caused by a parasite called Trichomonas vaginalis. Trichomoniasis most often spreads from person to person (is contagious) through sexual contact. What increases the risk? The following factors may make you more likely to develop this condition:  Having unprotected sex.  Having sex with a partner who has trichomoniasis.  Having  multiple sexual partners.  Having had previous trichomoniasis infections or other STIs. What are the signs or symptoms? In women, symptoms of trichomoniasis include:  Abnormal vaginal discharge that is clear, white, gray, or yellow-green and foamy and has an unusual "fishy" odor.  Itching and irritation of the vagina and vulva.  Burning or pain during urination or sex.  Redness and swelling of the genitals. In men, symptoms of trichomoniasis include:  Penile discharge that may be foamy or contain pus.  Pain in the penis. This may happen only when urinating.  Itching or irritation inside the penis.  Burning after urination or ejaculation. How is this diagnosed? In women, this condition may be found during a routine Pap test or physical exam. It may be found in men during a routine physical exam. Your health care provider may do tests to help diagnose this infection, such as:  Urine tests (men and women).  The following in women: ? Testing the pH of the vagina. ? A vaginal  swab test that checks for the Trichomonas vaginalis parasite. ? Testing vaginal secretions. Your health care provider may test you for other STIs, including HIV (human immunodeficiency virus). How is this treated? This condition is treated with medicine taken by mouth (orally), such as metronidazole or tinidazole, to fight the infection. Your sexual partner(s) also need to be tested and treated.  If you are a woman and you plan to become pregnant or think you may be pregnant, tell your health care provider right away. Some medicines that are used to treat the infection should not be taken during pregnancy. Your health care provider may recommend over-the-counter medicines or creams to help relieve itching or irritation. You may be tested for infection again 3 months after treatment. Follow these instructions at home:  Take and use over-the-counter and prescription medicines, including creams, only as told by  your health care provider.  Take your antibiotic medicine as told by your health care provider. Do not stop taking the antibiotic even if you start to feel better.  Do not have sex until 7-10 days after you finish your medicine, or until your health care provider approves. Ask your health care provider when you may start to have sex again.  (Women) Do not douche or wear tampons while you have the infection.  Discuss your infection with your sexual partner(s). Make sure that your partner gets tested and treated, if necessary.  Keep all follow-up visits as told by your health care provider. This is important. How is this prevented?   Use condoms every time you have sex. Using condoms correctly and consistently can help protect against STIs.  Avoid having multiple sexual partners.  Talk with your sexual partner about any symptoms that either of you may have, as well as any history of STIs.  Get tested for STIs and STDs (sexually transmitted diseases) before you have sex. Ask your partner to do the same.  Do not have sexual contact if you have symptoms of trichomoniasis or another STI. Contact a health care provider if:  You still have symptoms after you finish your medicine.  You develop pain in your abdomen.  You have pain when you urinate.  You have bleeding after sex.  You develop a rash.  You feel nauseous or you vomit.  You plan to become pregnant or think you may be pregnant. Summary  Trichomoniasis is an STI (sexually transmitted infection) that can affect both women and men.  This condition often has no symptoms (is asymptomatic), especially in men.  Without treatment, this condition can last for months or years.  You should not have sex until 7-10 days after you finish your medicine, or until your health care provider approves. Ask your health care provider when you may start to have sex again.  Discuss your infection with your sexual partner(s). Make sure that  your partner gets tested and treated, if necessary. This information is not intended to replace advice given to you by your health care provider. Make sure you discuss any questions you have with your health care provider. Document Revised: 06/03/2018 Document Reviewed: 06/03/2018 Elsevier Patient Education  [place].

## 2020-09-11 ENCOUNTER — Other Ambulatory Visit: Payer: Self-pay

## 2020-09-11 ENCOUNTER — Encounter: Payer: Self-pay | Admitting: Emergency Medicine

## 2020-09-11 ENCOUNTER — Emergency Department
Admission: EM | Admit: 2020-09-11 | Discharge: 2020-09-11 | Disposition: A | Discharge status: Home / Self Care | Payer: HRSA Program | Attending: Emergency Medicine | Admitting: Emergency Medicine

## 2020-09-11 DIAGNOSIS — F17210 Nicotine dependence, cigarettes, uncomplicated: Secondary | ICD-10-CM | POA: Insufficient documentation

## 2020-09-11 DIAGNOSIS — J029 Acute pharyngitis, unspecified: Secondary | ICD-10-CM | POA: Diagnosis present

## 2020-09-11 DIAGNOSIS — U071 COVID-19: Secondary | ICD-10-CM | POA: Diagnosis not present

## 2020-09-11 LAB — GROUP A STREP BY PCR: Group A Strep by PCR: NOT DETECTED

## 2020-09-11 MED ORDER — IBUPROFEN 600 MG PO TABS: 600.0000 mg | ORAL_TABLET | Freq: Three times a day (TID) | ORAL | 0 refills | Status: DC | PRN

## 2020-09-11 MED ORDER — PSEUDOEPH-BROMPHEN-DM 30-2-10 MG/5ML PO SYRP: 5.0000 mL | ORAL_SOLUTION | Freq: Four times a day (QID) | ORAL | 0 refills | Status: DC | PRN

## 2020-09-11 MED ORDER — LIDOCAINE VISCOUS HCL 2 % MT SOLN: 5.0000 mL | Freq: Four times a day (QID) | OROMUCOSAL | 0 refills | Status: DC | PRN

## 2020-09-11 NOTE — ED Provider Notes (Signed)
[facility] Emergency Department Provider Note   ____________________________________________   Event Date/Time   First MD Initiated Contact with Patient 09/11/20 [phone]     (approximate)  I have reviewed the triage vital signs and the nursing notes.   HISTORY  Chief Complaint Sore Throat, Nasal Congestion, and Cough    HPI Courtney Gordon is a 30 y.o. female patient presents with several days of sore throat, nonproductive cough, chest pain secondary to cough.  Patient denies recent travel but known contact with COVID-19.  Patient has  not taken the vaccine for COVID-19 or the flu shot.         Past Medical History:  Diagnosis Date  . Anemia   . Heart murmur    s/p ECHO during this pregnancy with EF >55%    Patient Active Problem List   Diagnosis Date Noted  . Acute blood loss anemia 03/01/2016  . Placental abruption in third trimester 02/28/2016  . Postpartum care following cesarean delivery 02/28/2016  . Maternal mitral valve prolapse affecting pregnancy in third trimester, antepartum 02/28/2016  . Anemia affecting pregnancy in third trimester, antepartum 02/28/2016  . Status post cesarean section 02/28/2016  . Size of fetus inconsistent with dates in third trimester 02/01/2016  . Gestational edema 01/18/2016  . Encounter for supervision of high risk pregnancy in second trimester, antepartum 11/08/2015  . Hyperemesis 10/17/2015  . Anemia 10/17/2015    Past Surgical History:  Procedure Laterality Date  . CESAREAN SECTION N/A 02/28/2016   Procedure: CESAREAN SECTION;  Surgeon: Will Bonnet, MD;  Location: [facility] ORS;  Service: Obstetrics;  Laterality: N/A;  . TONSILLECTOMY      Prior to Admission medications   Medication Sig Start Date End Date Taking? Authorizing Provider  brompheniramine-pseudoephedrine-DM 30-2-10 MG/5ML syrup Take 5 mLs by mouth 4 (four) times daily as needed. Mix with 5 mL of viscous lidocaine for swish and  swallow 09/11/20  Yes Sable Feil, PA-C  ibuprofen (ADVIL) 600 MG tablet Take 1 tablet (600 mg total) by mouth every 8 (eight) hours as needed. 09/11/20  Yes Sable Feil, PA-C  lidocaine (XYLOCAINE) 2 % solution Use as directed 5 mLs in the mouth or throat every 6 (six) hours as needed for mouth pain. Mix with 5 mL of Bromfed-DM for swish and swallow. 09/11/20  Yes Sable Feil, PA-C  Levonorgest-Eth Est & Eth Est Terence Lux) 42-21-21-7 DAYS TABS Take 1 each by mouth daily. 01/25/20 04/25/20  Homero Fellers, MD    Allergies Patient has no known allergies.  Family History  Problem Relation Age of Onset  . Hypertension Maternal Grandmother     Social History Social History   Tobacco Use  . Smoking status: Current Every Day Smoker    Packs/day: 0.00    Types: Cigars, Cigarettes  . Smokeless tobacco: Never Used  Substance Use Topics  . Alcohol use: Yes    Comment: occas  . Drug use: Yes    Types: Marijuana    Review of Systems Constitutional: No fever/chills Eyes: No visual changes. ENT: No sore throat. Cardiovascular: Denies chest pain. Respiratory: Denies shortness of breath. Gastrointestinal: No abdominal pain.  No nausea, no vomiting.  No diarrhea.  No constipation. Genitourinary: Negative for dysuria. Musculoskeletal: Chest wall pain secondary to coughing. Skin: Negative for rash. Neurological: Negative for headaches, focal weakness or numbness.   ____________________________________________   PHYSICAL EXAM:  VITAL SIGNS: ED Triage Vitals  Enc Vitals Group     BP  09/11/20 0909 (!) 108/41     Pulse Rate 09/11/20 0909 82     Resp 09/11/20 0909 18     Temp 09/11/20 0909 98.2 F (36.8 C)     Temp Source 09/11/20 0909 Oral     SpO2 09/11/20 0909 100 %     Weight 09/11/20 0851 130 lb (59 kg)     Height 09/11/20 0851 5\' 6"  (1.676 m)     Head Circumference --      Peak Flow --      Pain Score 09/11/20 0851 5     Pain Loc --      Pain Edu? --       Excl. in [place]? --    Constitutional: Alert and oriented. Well appearing and in no acute distress. Eyes: Conjunctivae are normal. PERRL. EOMI. Head: Atraumatic. Nose: Edematous nasal turbinates clear rhinorrhea.   Mouth/Throat: Mucous membranes are moist.  Oropharynx non-erythematous.  Postnasal drainage. Neck: No stridor.  Hematological/Lymphatic/Immunilogical: No cervical lymphadenopathy. Cardiovascular: Normal rate, regular rhythm. Grossly normal heart sounds.  Good peripheral circulation. Respiratory: Normal respiratory effort.  No retractions. Lungs CTAB. Gastrointestinal: Soft and nontender. No distention. No abdominal bruits. No CVA tenderness. Musculoskeletal: No lower extremity tenderness nor edema.  No joint effusions. Neurologic:  Normal speech and language. No gross focal neurologic deficits are appreciated. No gait instability. Skin:  Skin is warm, dry and intact. No rash noted. Psychiatric: Mood and affect are normal. Speech and behavior are normal.  ____________________________________________   LABS (all labs ordered are listed, but only abnormal results are displayed)  Labs Reviewed  GROUP A STREP BY PCR  SARS CORONAVIRUS 2 (TAT 6-24 HRS)   ____________________________________________  EKG   ____________________________________________  RADIOLOGY Cecilio Asper, personally viewed and evaluated these images (plain radiographs) as part of my medical decision making, as well as reviewing the written report by the radiologist.  ED MD interpretation:    Official radiology report(s): No results found.  ____________________________________________   PROCEDURES  Procedure(s) performed (including Critical Care):  Procedures   ____________________________________________   INITIAL IMPRESSION / ASSESSMENT AND PLAN / ED COURSE  As part of my medical decision making, I reviewed the following data within the [place]         Patient  presents presents with URI signs symptoms for several days and also complained of sore throat.  Patient had chest hurts when she coughs.  Patient was negative for strep pharyngitis.  Patient advised follow discharge care instruction self quarantine pending results of COVID-19 results.  Results may be found later in the MyChart app.  If test is positive was quarantine additional 10 days.      ____________________________________________   FINAL CLINICAL IMPRESSION(S) / ED DIAGNOSES  Final diagnoses:  Pharyngitis, unspecified etiology     ED Discharge Orders         Ordered    brompheniramine-pseudoephedrine-DM 30-2-10 MG/5ML syrup  4 times daily PRN        09/11/20 1148    lidocaine (XYLOCAINE) 2 % solution  Every 6 hours PRN        09/11/20 1148    ibuprofen (ADVIL) 600 MG tablet  Every 8 hours PRN        09/11/20 1148          *Please note:  Courtney Gordon was evaluated in Emergency Department on 09/11/2020 for the symptoms described in the history of present illness. She was evaluated in the context of the  global COVID-19 pandemic, which necessitated consideration that the patient might be at risk for infection with the SARS-CoV-2 virus that causes COVID-19. Institutional protocols and algorithms that pertain to the evaluation of patients at risk for COVID-19 are in a state of rapid change based on information released by regulatory bodies including the CDC and federal and state organizations. These policies and algorithms were followed during the patient's care in the ED.  Some ED evaluations and interventions may be delayed as a result of limited staffing during and the pandemic.*   Note:  This document was prepared using Dragon voice recognition software and may include unintentional dictation errors.    Sable Feil, PA-C 09/11/20 1150    Duffy Bruce, MD 09/12/20 Valerie Roys

## 2020-09-11 NOTE — Discharge Instructions (Addendum)
Follow discharge care instruction take medication as directed.  Advised self quarantine pending results of COVID-19 test.  You may find the results later in the MyChart app.  If positive must quarantine additional 10 days.

## 2020-09-11 NOTE — ED Triage Notes (Signed)
Pt reports upper resp symptoms for several days and pain in her chest when she coughs

## 2020-09-12 LAB — SARS CORONAVIRUS 2 (TAT 6-24 HRS): SARS Coronavirus 2: POSITIVE — AB

## 2020-11-03 ENCOUNTER — Ambulatory Visit: Payer: Self-pay | Admitting: Obstetrics and Gynecology

## 2021-03-14 ENCOUNTER — Emergency Department
Admission: EM | Admit: 2021-03-14 | Discharge: 2021-03-14 | Disposition: A | Discharge status: Home / Self Care | Payer: Self-pay | Attending: Emergency Medicine | Admitting: Emergency Medicine

## 2021-03-14 ENCOUNTER — Emergency Department: Payer: Self-pay

## 2021-03-14 ENCOUNTER — Other Ambulatory Visit: Payer: Self-pay

## 2021-03-14 DIAGNOSIS — D649 Anemia, unspecified: Secondary | ICD-10-CM | POA: Insufficient documentation

## 2021-03-14 DIAGNOSIS — M25562 Pain in left knee: Secondary | ICD-10-CM | POA: Insufficient documentation

## 2021-03-14 DIAGNOSIS — R2242 Localized swelling, mass and lump, left lower limb: Secondary | ICD-10-CM | POA: Insufficient documentation

## 2021-03-14 DIAGNOSIS — F17210 Nicotine dependence, cigarettes, uncomplicated: Secondary | ICD-10-CM | POA: Insufficient documentation

## 2021-03-14 DIAGNOSIS — O200 Threatened abortion: Secondary | ICD-10-CM | POA: Insufficient documentation

## 2021-03-14 LAB — CBC WITH DIFFERENTIAL/PLATELET
Abs Immature Granulocytes: 0.01 10*3/uL (ref 0.00–0.07)
Basophils Absolute: 0 10*3/uL (ref 0.0–0.1)
Basophils Relative: 1 %
Eosinophils Absolute: 0.1 10*3/uL (ref 0.0–0.5)
Eosinophils Relative: 2 %
HCT: 23.7 % — ABNORMAL LOW (ref 36.0–46.0)
Hemoglobin: 6.7 g/dL — ABNORMAL LOW (ref 12.0–15.0)
Immature Granulocytes: 0 %
Lymphocytes Relative: 29 %
Lymphs Abs: 1.1 10*3/uL (ref 0.7–4.0)
MCH: 17.7 pg — ABNORMAL LOW (ref 26.0–34.0)
MCHC: 28.3 g/dL — ABNORMAL LOW (ref 30.0–36.0)
MCV: 62.7 fL — ABNORMAL LOW (ref 80.0–100.0)
Monocytes Absolute: 0.2 10*3/uL (ref 0.1–1.0)
Monocytes Relative: 5 %
Neutro Abs: 2.4 10*3/uL (ref 1.7–7.7)
Neutrophils Relative %: 63 %
Platelets: 247 10*3/uL (ref 150–400)
RBC: 3.78 MIL/uL — ABNORMAL LOW (ref 3.87–5.11)
RDW: 22.1 % — ABNORMAL HIGH (ref 11.5–15.5)
Smear Review: NORMAL
WBC: 3.8 10*3/uL — ABNORMAL LOW (ref 4.0–10.5)
nRBC: 0 % (ref 0.0–0.2)

## 2021-03-14 LAB — BASIC METABOLIC PANEL
Anion gap: 3 — ABNORMAL LOW (ref 5–15)
BUN: 7 mg/dL (ref 6–20)
CO2: 26 mmol/L (ref 22–32)
Calcium: 9.1 mg/dL (ref 8.9–10.3)
Chloride: 108 mmol/L (ref 98–111)
Creatinine, Ser: 0.64 mg/dL (ref 0.44–1.00)
GFR, Estimated: >60 mL/min (ref >60)
Glucose, Bld: 91 mg/dL (ref 70–99)
Potassium: 3.9 mmol/L (ref 3.5–5.1)
Sodium: 137 mmol/L (ref 135–145)

## 2021-03-14 LAB — CK: Total CK: 100 U/L (ref 38–234)

## 2021-03-14 LAB — PROTIME-INR
INR: 1.3 — ABNORMAL HIGH (ref 0.8–1.2)
Prothrombin Time: 16.3 seconds — ABNORMAL HIGH (ref 11.4–15.2)

## 2021-03-14 LAB — HCG, QUANTITATIVE, PREGNANCY: hCG, Beta Chain, Quant, S: <1 m[IU]/mL (ref <5)

## 2021-03-14 LAB — PREPARE RBC (CROSSMATCH)

## 2021-03-14 LAB — MAGNESIUM: Magnesium: 1.8 mg/dL (ref 1.7–2.4)

## 2021-03-14 MED ORDER — LACTATED RINGERS IV BOLUS
1000.0000 mL | Freq: Once | INTRAVENOUS | Status: AC
  Administered 2021-03-14: 1000 mL via INTRAVENOUS

## 2021-03-14 MED ORDER — ACETAMINOPHEN 500 MG PO TABS
1000.0000 mg | ORAL_TABLET | Freq: Once | ORAL | Status: AC
  Administered 2021-03-14: 1000 mg via ORAL
  Filled 2021-03-14: qty 2

## 2021-03-14 MED ORDER — IBUPROFEN 400 MG PO TABS
400.0000 mg | ORAL_TABLET | Freq: Once | ORAL | Status: AC
  Administered 2021-03-14: 400 mg via ORAL
  Filled 2021-03-14: qty 1

## 2021-03-14 MED ORDER — OXYCODONE-ACETAMINOPHEN 5-325 MG PO TABS: 1.0000 | ORAL_TABLET | Freq: Three times a day (TID) | ORAL | 0 refills | Status: AC | PRN

## 2021-03-14 MED ORDER — OXYCODONE HCL 5 MG PO TABS
5.0000 mg | ORAL_TABLET | ORAL | Status: AC
Start: 2021-03-14 — End: 2021-03-14
  Administered 2021-03-14: 5 mg via ORAL
  Filled 2021-03-14: qty 1

## 2021-03-14 MED ORDER — LORAZEPAM 2 MG/ML IJ SOLN
0.5000 mg | Freq: Once | INTRAMUSCULAR | Status: AC
  Administered 2021-03-14: 0.5 mg via INTRAVENOUS
  Filled 2021-03-14: qty 1

## 2021-03-14 MED ORDER — SODIUM CHLORIDE 0.9 % IV SOLN
10.0000 mL/h | Freq: Once | INTRAVENOUS | Status: AC
  Administered 2021-03-14: 10 mL/h via INTRAVENOUS

## 2021-03-14 MED ORDER — LIDOCAINE 5 % EX PTCH
1.0000 | MEDICATED_PATCH | CUTANEOUS | Status: DC
  Administered 2021-03-14: 1 via TRANSDERMAL
  Filled 2021-03-14: qty 1

## 2021-03-14 MED ORDER — CYCLOBENZAPRINE HCL 10 MG PO TABS
10.0000 mg | ORAL_TABLET | Freq: Once | ORAL | Status: AC
  Administered 2021-03-14: 10 mg via ORAL
  Filled 2021-03-14: qty 1

## 2021-03-14 NOTE — ED Provider Notes (Signed)
[facility] Emergency Department Provider Note  ____________________________________________   Event Date/Time   First MD Initiated Contact with Patient 03/14/21 1158     (approximate)  I have reviewed the triage vital signs and the nursing notes.   HISTORY  Chief Complaint Knee Pain   HPI Courtney Gordon is a 30 y.o. female with a past medical history of iron deficiency anemia requiring iron transfusions and PRBC transfusions in the past although not currently on iron supplementation who presents for assessment of some acute nontraumatic pain on her left knee.  Patient states she went to bed late last night and woke up with it with fairly severe pain especially when asked him to extend the knee.  She does not recall any injuries or falls.  She feels the pain seems to become especially bad whenever she extends it.  It seems to radiate up into the hip.  She denies any foot or ankle pain or any pain in her right lower extremity arms, chest, abdomen, back head ears eyes nose or throat.  She has not had any recent fevers, cough, vomiting, diarrhea, dysuria, rash or prior similar pain.  No other acute concerns at this time.  No medications right before arrival.  Patient is on any blood thinners and denies any recent bleeding including heavy menstrual periods blood in her stool or urine or any melanotic stools.         Past Medical History:  Diagnosis Date   Anemia    Heart murmur    s/p ECHO during this pregnancy with EF >55%    Patient Active Problem List   Diagnosis Date Noted   Acute blood loss anemia 03/01/2016   Placental abruption in third trimester 02/28/2016   Postpartum care following cesarean delivery 02/28/2016   Maternal mitral valve prolapse affecting pregnancy in third trimester, antepartum 02/28/2016   Anemia affecting pregnancy in third trimester, antepartum 02/28/2016   Status post cesarean section 02/28/2016   Size of fetus inconsistent  with dates in third trimester 02/01/2016   Gestational edema 01/18/2016   Encounter for supervision of high risk pregnancy in second trimester, antepartum 11/08/2015   Hyperemesis 10/17/2015   Anemia 10/17/2015    Past Surgical History:  Procedure Laterality Date   CESAREAN SECTION N/A 02/28/2016   Procedure: CESAREAN SECTION;  Surgeon: Will Bonnet, MD;  Location: [facility] ORS;  Service: Obstetrics;  Laterality: N/A;   TONSILLECTOMY      Prior to Admission medications   Medication Sig Start Date End Date Taking? Authorizing Provider  oxyCODONE-acetaminophen (PERCOCET) 5-325 MG tablet Take 1 tablet by mouth every 8 (eight) hours as needed for up to 5 days for severe pain. 03/14/21 03/19/21 Yes Lucrezia Starch, MD  Levonorgest-Eth Est & Eth Est Terence Lux) 42-21-21-7 DAYS TABS Take 1 each by mouth daily. 01/25/20 04/25/20  Homero Fellers, MD    Allergies Patient has no known allergies.  Family History  Problem Relation Age of Onset   Hypertension Maternal Grandmother     Social History Social History   Tobacco Use   Smoking status: Every Day    Packs/day: 0.00    Pack years: 0.00    Types: Cigars, Cigarettes   Smokeless tobacco: Never  Substance Use Topics   Alcohol use: Yes    Comment: occas   Drug use: Yes    Types: Marijuana    Review of Systems  Review of Systems  Constitutional:  Negative for chills and fever.  HENT:  Negative for sore throat.   Eyes:  Negative for pain.  Respiratory:  Negative for cough and stridor.   Cardiovascular:  Negative for chest pain.  Gastrointestinal:  Negative for vomiting.  Musculoskeletal:  Positive for joint pain (L knee) and myalgias.  Skin:  Negative for rash.  Neurological:  Positive for focal weakness (L knee). Negative for seizures, loss of consciousness and headaches.  Psychiatric/Behavioral:  Negative for suicidal ideas.   All other systems reviewed and are negative.     ____________________________________________   PHYSICAL EXAM:  VITAL SIGNS: ED Triage Vitals  Enc Vitals Group     BP 03/14/21 1154 92/62     Pulse Rate 03/14/21 1154 88     Resp 03/14/21 1154 18     Temp 03/14/21 1154 98.7 F (37.1 C)     Temp Source 03/14/21 1154 Oral     SpO2 03/14/21 1154 100 %     Weight 03/14/21 1152 130 lb (59 kg)     Height 03/14/21 1152 5\' 5"  (1.651 m)     Head Circumference --      Peak Flow --      Pain Score 03/14/21 1152 10     Pain Loc --      Pain Edu? --      Excl. in [place]? --    Vitals:   03/14/21 1520 03/14/21 1600  BP: (!) 116/51 106/60  Pulse: 65 63  Resp: 17 14  Temp: 98.3 F (36.8 C)   SpO2:  100%   Physical Exam Vitals and nursing note reviewed.  Constitutional:      General: She is not in acute distress.    Appearance: She is well-developed.  HENT:     Head: Normocephalic and atraumatic.     Right Ear: External ear normal.     Left Ear: External ear normal.     Nose: Nose normal.  Eyes:     Conjunctiva/sclera: Conjunctivae normal.  Cardiovascular:     Rate and Rhythm: Normal rate and regular rhythm.     Heart sounds: No murmur heard. Pulmonary:     Effort: Pulmonary effort is normal. No respiratory distress.     Breath sounds: Normal breath sounds.  Abdominal:     Palpations: Abdomen is soft.     Tenderness: There is no abdominal tenderness. There is no right CVA tenderness or left CVA tenderness.  Musculoskeletal:     Cervical back: Neck supple.  Skin:    General: Skin is warm and dry.  Neurological:     Mental Status: She is alert and oriented to person, place, and time.  Psychiatric:        Mood and Affect: Mood normal.    Patient has full strength of the right lower extremity.  She has full strength of the left ankle in dorsi and plantar flexion.  She is also flexing to the left hip with a significant decrease in strength in the left knee.  There is no large effusion with some mild tenderness of the  posterior aspect.  No erythema, warmth, induration or other overlying skin changes.  Sensation is intact to light touch throughout the left lower extremity.  2+ DP pulses.  Patient's back and CVA areas are unremarkable. ____________________________________________   LABS (all labs ordered are listed, but only abnormal results are displayed)  Labs Reviewed  BASIC METABOLIC PANEL - Abnormal; Notable for the following components:      Result Value   Anion gap 3 (*)  All other components within normal limits  CBC WITH DIFFERENTIAL/PLATELET - Abnormal; Notable for the following components:   WBC 3.8 (*)    RBC 3.78 (*)    Hemoglobin 6.7 (*)    HCT 23.7 (*)    MCV 62.7 (*)    MCH 17.7 (*)    MCHC 28.3 (*)    RDW 22.1 (*)    All other components within normal limits  PROTIME-INR - Abnormal; Notable for the following components:   Prothrombin Time 16.3 (*)    INR 1.3 (*)    All other components within normal limits  MAGNESIUM  CK  TYPE AND SCREEN  PREPARE RBC (CROSSMATCH)   ____________________________________________  EKG  ____________________________________________  RADIOLOGY  ED MD interpretation: Ultrasound the left lower extremity shows no evidence of DVT.  No evidence of Baker's cyst or other abnormality noted.  Plain film of left knee shows no clear acute fracture or dislocation.  Type I bipartite patella.  Official radiology report(s): US Venous Img Lower Unilateral Left  Result Date: 03/14/2021 CLINICAL DATA:  Pain EXAM: LEFT LOWER EXTREMITY VENOUS DOPPLER ULTRASOUND TECHNIQUE: Gray-scale sonography with compression, as well as color and duplex ultrasound, were performed to evaluate the deep venous system(s) from the level of the common femoral vein through the popliteal and proximal calf veins. COMPARISON:  None. FINDINGS: VENOUS Normal compressibility of the common femoral, superficial femoral, and popliteal veins, as well as the visualized calf veins. Visualized  portions of profunda femoral vein and great saphenous vein unremarkable. No filling defects to suggest DVT on grayscale or color Doppler imaging. Doppler waveforms show normal direction of venous flow, normal respiratory phasicity and response to augmentation. Limited views of the contralateral common femoral vein are unremarkable. OTHER None. Limitations: none IMPRESSION: No femoropopliteal DVT nor evidence of DVT within the visualized calf veins. If clinical symptoms are inconsistent or if there are persistent or worsening symptoms, further imaging (possibly involving the iliac veins) may be warranted. Electronically Signed   By: Lucrezia Europe M.D.   On: 03/14/2021 14:33   DG Knee Complete 4 Views Left  Result Date: 03/14/2021 CLINICAL DATA:  Left knee pain. EXAM: LEFT KNEE - COMPLETE 4+ VIEW COMPARISON:  None. FINDINGS: The joint spaces are maintained. No acute fracture is identified. No osteochondral lesion. No joint effusion. Prominent unfused secondary ossification center involving inferior pole of the patella (type 1 bipartite patella). IMPRESSION: 1. No acute bony findings or significant degenerative changes. 2. Type 1 Bipartite patella. Electronically Signed   By: Marijo Sanes M.D.   On: 03/14/2021 14:30    ____________________________________________   PROCEDURES  Procedure(s) performed (including Critical Care):  .Critical Care  Date/Time: 03/14/2021 4:40 PM Performed by: Lucrezia Starch, MD Authorized by: Lucrezia Starch, MD   Critical care provider statement:    Critical care time (minutes):  45   Critical care was necessary to treat or prevent imminent or life-threatening deterioration of the following conditions:  Circulatory failure   Critical care was time spent personally by me on the following activities:  Discussions with consultants, evaluation of patient's response to treatment, examination of patient, ordering and performing treatments and interventions, ordering and  review of laboratory studies, ordering and review of radiographic studies, pulse oximetry, re-evaluation of patient's condition, obtaining history from patient or surrogate and review of old charts   ____________________________________________   INITIAL IMPRESSION / [place] / ED COURSE      Patient presents with above to history exam for  assessment of fairly significant acute nontraumatic left knee pain primarily in the posterior aspect.  On arrival she is afebrile and hemodynamically stable.  In addition to denying any trauma she denies any other clear associated sick symptoms or prior episodes.  Initial differential includes possible DVT, arthritis, myositis peripheral neuropathy from possible compression that she states she may have slept on abnormally last night.  No evidence of cellulitis on history or exam to suggest a septic joint.  But radiates into left hip has no significant pain on range of motion of left hip and I have a low suspicion for acute left hip pathology or other immediate life-threatening intra-abdominal pelvic process.  Ultrasound shows no evidence of DVT and x-ray shows no fracture dislocation but type I bipartite patella which I suspect is likely incidental.  Undergoing work-up of her left knee pain did obtain a CBC that showed hemoglobin of 6.7.  Seems this has been a chronic issue for the patient and she denies any recent bleeding or melanotic stools suspect this is likely secondary to chronic iron deficiency anemia.  She denies any manic symptoms i.e. shortness of breath, dizziness, chest pain or acute fatigue and Evalose patient for acute blood loss anemia.  We will give 1 unit of PRBCs here and with regards to this I think she is going to be able to follow-up with oncology.  CBC shows WBC count of 3.8 and platelets are unremarkable.  Magnesium is unremarkable.  BMP shows no significant electrode or metabolic derangements.  CK is within normal limits and  not consistent with myositis.  Patient did have some improvement in range of motion still had fairly significant pain and not able to fully extend the knee on reassessment.  She was able to ambulate with crutches.  Overall unclear etiology for patient's pain also and possible shortness of atypical radiculopathy versus muscle cramp versus some arthritis in the knee.  However given stable vitals with otherwise reassuring exam work-up I think she is stable for close outpatient PCP follow-up.  Also advised her to follow-up with his oncologist that she will likely require restarting iron transfusions states she is not normal for them and taking p.o.  Short course of analgesia given.  Discharged stable condition.  Strict return precautions advised and discussed including any change in the location or quality of her pain, redness, swelling, fevers, chest pain, cough, lightheadedness, dizziness or new back or abdominal pain.   ____________________________________________   FINAL CLINICAL IMPRESSION(S) / ED DIAGNOSES  Final diagnoses:  Acute pain of left knee  Anemia, unspecified type    Medications  lidocaine (LIDODERM) 5 % 1 patch (1 patch Transdermal Patch Applied 03/14/21 1401)  acetaminophen (TYLENOL) tablet 1,000 mg (1,000 mg Oral Given 03/14/21 1212)  ibuprofen (ADVIL) tablet 400 mg (400 mg Oral Given 03/14/21 1212)  cyclobenzaprine (FLEXERIL) tablet 10 mg (10 mg Oral Given 03/14/21 1213)  oxyCODONE (Oxy IR/ROXICODONE) immediate release tablet 5 mg (5 mg Oral Given 03/14/21 1246)  lactated ringers bolus 1,000 mL (0 mLs Intravenous Stopping Infusion hung by another clincian 03/14/21 1416)  LORazepam (ATIVAN) injection 0.5 mg (0.5 mg Intravenous Given 03/14/21 1411)  0.9 %  sodium chloride infusion (0 mL/hr Intravenous Stopped 03/14/21 1821)     ED Discharge Orders          Ordered    oxyCODONE-acetaminophen (PERCOCET) 5-325 MG tablet  Every 8 hours PRN        03/14/21 1811  Note:  This document was prepared using Dragon voice recognition software and may include unintentional dictation errors.    Lucrezia Starch, MD 03/14/21 [phone]

## 2021-03-14 NOTE — ED Notes (Signed)
Pt tolerating blood transfusion well.

## 2021-03-14 NOTE — ED Notes (Addendum)
See triage note  Presents with left knee pain  Woke up with worsen pain this am  No injury  No deformity  Dr Tamala Julian in with pt on arrival  States she rolled over when pain started

## 2021-03-14 NOTE — ED Notes (Signed)
Report called to [facility] moved to room 33

## 2021-03-14 NOTE — ED Triage Notes (Signed)
Pt comes into the ED via EMS from home with c/o left knee pain today , with a previous injury

## 2021-03-15 LAB — TYPE AND SCREEN
ABO/RH(D): A POS
Antibody Screen: NEGATIVE
Unit division: 0

## 2021-03-15 LAB — BPAM RBC
Blood Product Expiration Date: 202208042359
ISSUE DATE / TIME: 202207121522
Unit Type and Rh: 6200

## 2021-03-18 ENCOUNTER — Other Ambulatory Visit: Payer: Self-pay

## 2021-03-18 ENCOUNTER — Emergency Department
Admission: EM | Admit: 2021-03-18 | Discharge: 2021-03-18 | Disposition: A | Discharge status: Home / Self Care | Payer: Self-pay | Attending: Emergency Medicine | Admitting: Emergency Medicine

## 2021-03-18 DIAGNOSIS — M25562 Pain in left knee: Secondary | ICD-10-CM

## 2021-03-18 DIAGNOSIS — F17210 Nicotine dependence, cigarettes, uncomplicated: Secondary | ICD-10-CM | POA: Insufficient documentation

## 2021-03-18 DIAGNOSIS — M25462 Effusion, left knee: Secondary | ICD-10-CM | POA: Insufficient documentation

## 2021-03-18 LAB — CBC WITH DIFFERENTIAL/PLATELET
Abs Immature Granulocytes: 0.01 10*3/uL (ref 0.00–0.07)
Basophils Absolute: 0 10*3/uL (ref 0.0–0.1)
Basophils Relative: 1 %
Eosinophils Absolute: 0.2 10*3/uL (ref 0.0–0.5)
Eosinophils Relative: 4 %
HCT: 28.3 % — ABNORMAL LOW (ref 36.0–46.0)
Hemoglobin: 8.1 g/dL — ABNORMAL LOW (ref 12.0–15.0)
Immature Granulocytes: 0 %
Lymphocytes Relative: 31 %
Lymphs Abs: 1.4 10*3/uL (ref 0.7–4.0)
MCH: 18.5 pg — ABNORMAL LOW (ref 26.0–34.0)
MCHC: 28.6 g/dL — ABNORMAL LOW (ref 30.0–36.0)
MCV: 64.5 fL — ABNORMAL LOW (ref 80.0–100.0)
Monocytes Absolute: 0.3 10*3/uL (ref 0.1–1.0)
Monocytes Relative: 7 %
Neutro Abs: 2.5 10*3/uL (ref 1.7–7.7)
Neutrophils Relative %: 57 %
Platelets: 274 10*3/uL (ref 150–400)
RBC: 4.39 MIL/uL (ref 3.87–5.11)
RDW: 25.9 % — ABNORMAL HIGH (ref 11.5–15.5)
WBC: 4.4 10*3/uL (ref 4.0–10.5)
nRBC: 0 % (ref 0.0–0.2)

## 2021-03-18 LAB — SYNOVIAL CELL COUNT + DIFF, W/ CRYSTALS
Crystals, Fluid: NEGATIVE
Lymphocytes-Synovial Fld: 6 %
Monocyte-Macrophage-Synovial Fluid: 56 %
Neutrophil, Synovial: 38 %
WBC, Synovial: 322 /mm3 — ABNORMAL HIGH (ref 0–200)

## 2021-03-18 LAB — BASIC METABOLIC PANEL
Anion gap: 4 — ABNORMAL LOW (ref 5–15)
BUN: 9 mg/dL (ref 6–20)
CO2: 18 mmol/L — ABNORMAL LOW (ref 22–32)
Calcium: 7.7 mg/dL — ABNORMAL LOW (ref 8.9–10.3)
Chloride: 115 mmol/L — ABNORMAL HIGH (ref 98–111)
Creatinine, Ser: 0.37 mg/dL — ABNORMAL LOW (ref 0.44–1.00)
GFR, Estimated: >60 mL/min (ref >60)
Glucose, Bld: 73 mg/dL (ref 70–99)
Potassium: 3.6 mmol/L (ref 3.5–5.1)
Sodium: 137 mmol/L (ref 135–145)

## 2021-03-18 LAB — C-REACTIVE PROTEIN: CRP: 0.6 mg/dL (ref <1.0)

## 2021-03-18 LAB — SEDIMENTATION RATE: Sed Rate: 30 mm/hr — ABNORMAL HIGH (ref 0–20)

## 2021-03-18 MED ORDER — METHOCARBAMOL 500 MG PO TABS
500.0000 mg | ORAL_TABLET | Freq: Once | ORAL | Status: AC
  Administered 2021-03-18: 500 mg via ORAL
  Filled 2021-03-18: qty 1

## 2021-03-18 MED ORDER — PREDNISONE 10 MG PO TABS: 10.0000 mg | ORAL_TABLET | Freq: Every day | ORAL | 0 refills | Status: DC

## 2021-03-18 MED ORDER — ACETAMINOPHEN 500 MG PO TABS
1000.0000 mg | ORAL_TABLET | Freq: Once | ORAL | Status: AC
  Administered 2021-03-18: 1000 mg via ORAL
  Filled 2021-03-18: qty 2

## 2021-03-18 MED ORDER — OXYCODONE HCL 5 MG PO TABS
5.0000 mg | ORAL_TABLET | Freq: Once | ORAL | Status: AC
  Administered 2021-03-18: 5 mg via ORAL
  Filled 2021-03-18: qty 1

## 2021-03-18 MED ORDER — FENTANYL CITRATE (PF) 100 MCG/2ML IJ SOLN
50.0000 ug | Freq: Once | INTRAMUSCULAR | Status: AC
  Administered 2021-03-18: 50 ug via INTRAVENOUS
  Filled 2021-03-18: qty 2

## 2021-03-18 MED ORDER — LIDOCAINE HCL (PF) 1 % IJ SOLN
5.0000 mL | Freq: Once | INTRAMUSCULAR | Status: AC
  Administered 2021-03-18: 5 mL
  Filled 2021-03-18: qty 5

## 2021-03-18 MED ORDER — KETOROLAC TROMETHAMINE 30 MG/ML IJ SOLN
30.0000 mg | Freq: Once | INTRAMUSCULAR | Status: AC
  Administered 2021-03-18: 30 mg via INTRAMUSCULAR
  Filled 2021-03-18: qty 1

## 2021-03-18 NOTE — ED Notes (Signed)
D/C and new RX and reasons to return to ED discussed with pt, pt verbalized understanding. NAD noted.

## 2021-03-18 NOTE — ED Triage Notes (Addendum)
Pt to ED for continued pain to left leg, has been seen at multiple ER's for complaint. Was seen on Tuesday and given blood transfusion and knee brace. Chronic anemia. Here for different pain meds, states having reaction percocet (itching and sweating) and wants a different medication.   Has already have several [place], attempted Xrays,  has not yet followed up with ortho.   Leg wrapped at this time

## 2021-03-18 NOTE — ED Provider Notes (Signed)
-----------------------------------------   6:00 PM on 03/18/2021 ----------------------------------------- Synovial fluid shows 322 white cells, not concerning for septic joint.  No crystals.  Given the patient's elevated ESR we will discharge with a steroid taper.  Patient agreeable to plan of care.   Harvest Dark, MD 03/18/21 1800

## 2021-03-18 NOTE — ED Provider Notes (Signed)
[facility] Emergency Department Provider Note ____________________________________________   Event Date/Time   First MD Initiated Contact with Patient 03/18/21 1227     (approximate)  I have reviewed the triage vital signs and the nursing notes.  HISTORY  Chief Complaint Leg Pain   HPI Courtney Gordon is a 30 y.o. femalewho presents to the ED for evaluation of continued left leg pain.  Chart review indicates history of iron deficiency anemia requiring transfusions in the past, as recently as 4 days ago. She has been seen twice in the emergency department in the past 4 days due to atraumatic left leg pain with work-ups including 2 separate negative venous ultrasounds, plain film of the left knee, STD testing GC being negative.  Repeat hemoglobin 3 days ago with hemoglobin of 7.5, improving appropriately after her unit of blood 4 days ago.  She has been referred to orthopedic surgery.  Patient presents to the ED today for evaluation of continued left leg pain.  She reports primarily pain to her left knee, but also a degree of aching and shooting pain into the lateral aspect of her left leg in its entirety from her hip down to her ankle.  She reports she has not walked for the past 5 or 6 days due to this pain.  She reports poor pain control with the Percocet that she has been provided previously.   She denies fevers or systemic symptoms, syncopal episodes or vaginal bleeding.  Past Medical History:  Diagnosis Date   Anemia    Heart murmur    s/p ECHO during this pregnancy with EF >55%    Patient Active Problem List   Diagnosis Date Noted   Acute blood loss anemia 03/01/2016   Placental abruption in third trimester 02/28/2016   Postpartum care following cesarean delivery 02/28/2016   Maternal mitral valve prolapse affecting pregnancy in third trimester, antepartum 02/28/2016   Anemia affecting pregnancy in third trimester, antepartum 02/28/2016    Status post cesarean section 02/28/2016   Size of fetus inconsistent with dates in third trimester 02/01/2016   Gestational edema 01/18/2016   Encounter for supervision of high risk pregnancy in second trimester, antepartum 11/08/2015   Hyperemesis 10/17/2015   Anemia 10/17/2015    Past Surgical History:  Procedure Laterality Date   CESAREAN SECTION N/A 02/28/2016   Procedure: CESAREAN SECTION;  Surgeon: Will Bonnet, MD;  Location: [facility] ORS;  Service: Obstetrics;  Laterality: N/A;   TONSILLECTOMY      Prior to Admission medications   Medication Sig Start Date End Date Taking? Authorizing Provider  Levonorgest-Eth Est & Eth Est (QUARTETTE) 42-21-21-7 DAYS TABS Take 1 each by mouth daily. 01/25/20 04/25/20  Schuman, Stefanie Libel, MD  oxyCODONE-acetaminophen (PERCOCET) 5-325 MG tablet Take 1 tablet by mouth every 8 (eight) hours as needed for up to 5 days for severe pain. 03/14/21 03/19/21  Lucrezia Starch, MD    Allergies Patient has no known allergies.  Family History  Problem Relation Age of Onset   Hypertension Maternal Grandmother     Social History Social History   Tobacco Use   Smoking status: Every Day    Packs/day: 0.00    Types: Cigars, Cigarettes   Smokeless tobacco: Never  Substance Use Topics   Alcohol use: Yes    Comment: occas   Drug use: Yes    Types: Marijuana    Review of Systems  Constitutional: No fever/chills Eyes: No visual changes. ENT: No sore throat. Cardiovascular: Denies  chest pain. Respiratory: Denies shortness of breath. Gastrointestinal: No abdominal pain.  No nausea, no vomiting.  No diarrhea.  No constipation. Genitourinary: Negative for dysuria. Musculoskeletal: Negative for back pain. Positive for atraumatic left knee pain and swelling Skin: Negative for rash. Neurological: Negative for headaches, focal weakness or numbness.  ____________________________________________   PHYSICAL EXAM:  VITAL SIGNS: Vitals:   03/18/21  1300 03/18/21 1430  BP: 101/63 107/65  Pulse:  76  Resp:  16  Temp:    SpO2:  100%    Constitutional: Alert and oriented. Well appearing and in no acute distress. Eyes: Conjunctivae are normal. PERRL. EOMI. Head: Atraumatic. Nose: No congestion/rhinnorhea. Mouth/Throat: Mucous membranes are moist.  Oropharynx non-erythematous. Neck: No stridor. No cervical spine tenderness to palpation. Cardiovascular: Normal rate, regular rhythm. Grossly normal heart sounds.  Good peripheral circulation. Respiratory: Normal respiratory effort.  No retractions. Lungs CTAB. Gastrointestinal: Soft , nondistended, nontender to palpation. No CVA tenderness. Musculoskeletal: Left leg generally appears symmetric to the right without pitting edema or diffuse swelling, but she does have focal swelling about her left knee diffusely without skin changes or signs of trauma.  Pain with ranging the left knee passively and actively. Tight and palpable iliotibial band on the left with some mild tenderness, otherwise soft thigh without tenderness. More distally, she has some very mild tenderness throughout the lateral aspect of her left calf, but her calf is soft and has no posterior tenderness, no palpable Baker's cyst.  No significant pain or tenderness with ranging the left ankle. No neurologic or vascular deficits, sensation intact distally and strong DP pulse with brisk capillary refill. Neurologic:  Normal speech and language. No gross focal neurologic deficits are appreciated. No gait instability noted. Skin:  Skin is warm, dry and intact. No rash noted. Psychiatric: Mood and affect are normal. Speech and behavior are normal. ____________________________________________   LABS (all labs ordered are listed, but only abnormal results are displayed)  Labs Reviewed  CBC WITH DIFFERENTIAL/PLATELET - Abnormal; Notable for the following components:      Result Value   Hemoglobin 8.1 (*)    HCT 28.3 (*)    MCV 64.5  (*)    MCH 18.5 (*)    MCHC 28.6 (*)    RDW 25.9 (*)    All other components within normal limits  SEDIMENTATION RATE - Abnormal; Notable for the following components:   Sed Rate 30 (*)    All other components within normal limits  BASIC METABOLIC PANEL - Abnormal; Notable for the following components:   Chloride 115 (*)    CO2 18 (*)    Creatinine, Ser 0.37 (*)    Calcium 7.7 (*)    Anion gap 4 (*)    All other components within normal limits  BODY FLUID CULTURE W GRAM STAIN  C-REACTIVE PROTEIN  GLUCOSE, BODY FLUID OTHER            PROTEIN, BODY FLUID (OTHER)  SYNOVIAL CELL COUNT + DIFF, W/ CRYSTALS  URIC ACID, BODY FLUID  POC URINE PREG, ED   ____________________________________________  12 Lead EKG   ____________________________________________  RADIOLOGY  ED MD interpretation:    Official radiology report(s): No results found.  ____________________________________________   PROCEDURES and INTERVENTIONS  Procedure(s) performed (including Critical Care):  .Joint Aspiration/Arthrocentesis  Date/Time: 03/18/2021 2:48 PM Performed by: Vladimir Crofts, MD Authorized by: Vladimir Crofts, MD   Consent:    Consent obtained:  Verbal   Consent given by:  Patient   Risks, benefits,  and alternatives were discussed: yes     Risks discussed:  Bleeding, infection, pain and incomplete drainage   Alternatives discussed:  No treatment Location:    Location:  Knee   Knee:  L knee Anesthesia:    Anesthesia method:  Local infiltration   Local anesthetic:  Lidocaine 1% w/o epi Procedure details:    Preparation: Patient was prepped and draped in usual sterile fashion     Needle gauge: 21g.   Ultrasound guidance: no     Approach:  Lateral   Aspirate amount:  40m   Aspirate characteristics:  Cloudy and yellow   Steroid injected: no     Specimen collected: yes   Post-procedure details:    Dressing:  Adhesive bandage   Procedure completion:  Tolerated well, no immediate  complications  Medications  acetaminophen (TYLENOL) tablet 1,000 mg (1,000 mg Oral Given 03/18/21 1328)  oxyCODONE (Oxy IR/ROXICODONE) immediate release tablet 5 mg (5 mg Oral Given 03/18/21 1328)  ketorolac (TORADOL) 30 MG/ML injection 30 mg (30 mg Intramuscular Given 03/18/21 1328)  methocarbamol (ROBAXIN) tablet 500 mg (500 mg Oral Given 03/18/21 1408)  lidocaine (PF) (XYLOCAINE) 1 % injection 5 mL (5 mLs Infiltration Given 03/18/21 1408)  fentaNYL (SUBLIMAZE) injection 50 mcg (50 mcg Intravenous Given 03/18/21 1417)    ____________________________________________   MDM / ED COURSE   Largely healthy 30year old woman presents to the ED with about 5 days of atraumatic and new left leg pain, primarily her left knee, requiring arthrocentesis to rule out septic arthritis and gout.  She has had extensive work-up over the past week with blood work, plain films, DVT ultrasounds that have all been quite unremarkable.  Blood work here demonstrates microcytic anemia without worsening or need for repeat transfusion.  Slightly elevated ESR is noted.  She does have an effusion on examination, without overlying trauma or skin changes.  No neurologic or vascular deficits.  Arthrocentesis performed yielding cloudy yellow fluid that is not grossly purulent.  We will send for appropriate studies and patient will be signed out to oncoming provider to follow-up on his studies. There is no evidence of severe arthritis, anticipate outpatient management with orthopedic follow-up.  Clinical Course as of 03/18/21 1520  Sat Mar 18, 2021  1330 Discussed with the patient my recommendations for knee arthrocentesis considering her rather extensive work-up throughout the week, but this is not been performed, and still some diagnostic uncertainty. [DS]  [id number]  Patient reports improving pain. [DS]  12574Knee arthrocentesis performed, well-tolerated. [DS]  19355Slightly cloudy yellow fluid obtained.  Not grossly  purulent.  We will send for diagnostics.  Patient reports improved pain after removal of 17 cc of fluid [DS]    Clinical Course User Index [DS] SVladimir Crofts MD    ____________________________________________   FINAL CLINICAL IMPRESSION(S) / ED DIAGNOSES  Final diagnoses:  Acute pain of left knee  Effusion of left knee     ED Discharge Orders     None        Serafina Topham   Note:  This document was prepared using Dragon voice recognition software and may include unintentional dictation errors.    SVladimir Crofts MD 03/18/21 [phone]

## 2021-03-20 LAB — PROTEIN, BODY FLUID (OTHER): Total Protein, Body Fluid Other: 3.4 g/dL

## 2021-03-20 LAB — GLUCOSE, BODY FLUID OTHER: Glucose, Body Fluid Other: 73 mg/dL

## 2021-03-22 LAB — BODY FLUID CULTURE W GRAM STAIN
Culture: NO GROWTH
Special Requests: NORMAL

## 2021-03-22 LAB — URIC ACID, BODY FLUID: Uric Acid Body Fluid: 3.1 mg/dL

## 2021-07-27 IMAGING — US US EXTREM LOW VENOUS*L*
1 series · 14 of 24 positions shown · non-contrast
Comparison: None.

CLINICAL DATA: Pain

EXAM:
LEFT LOWER EXTREMITY VENOUS DOPPLER ULTRASOUND
TECHNIQUE: Gray-scale sonography with compression, as well as color and duplex
ultrasound, were performed to evaluate the deep venous system(s)
from the level of the common femoral vein through the popliteal and
proximal calf veins.

[Series 1: us venous img lower uni left (dvt) · portal-venous · 14 of 44 slices shown]
[im 1/44]
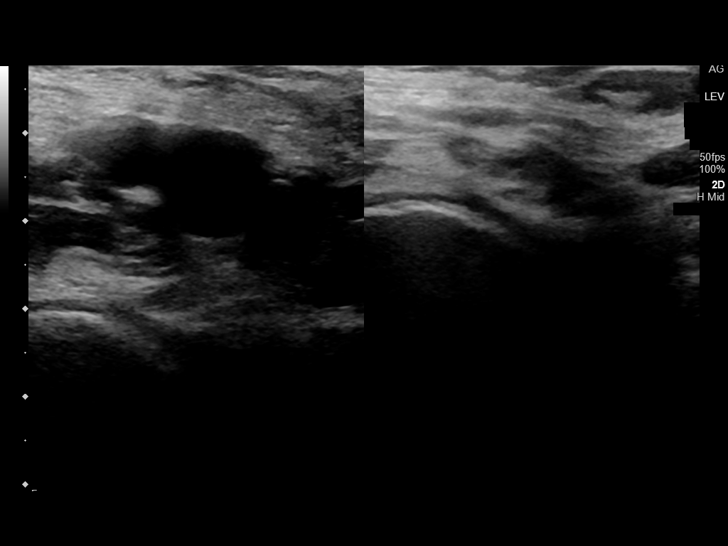
[im 4/44]
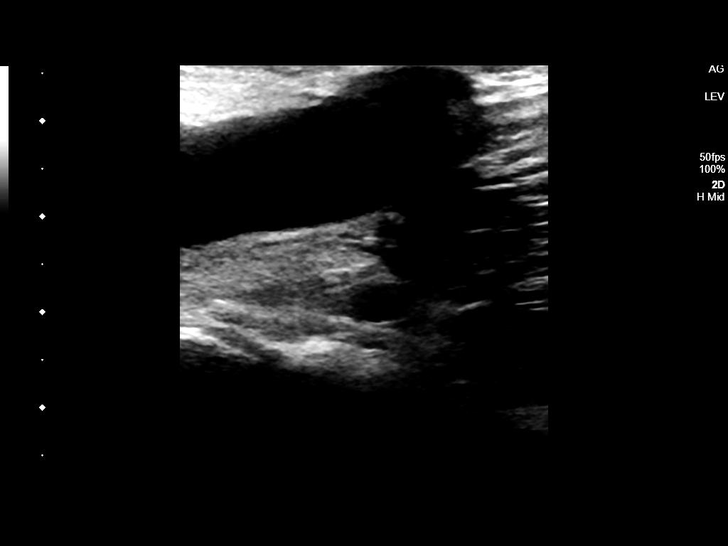
[im 8/44]
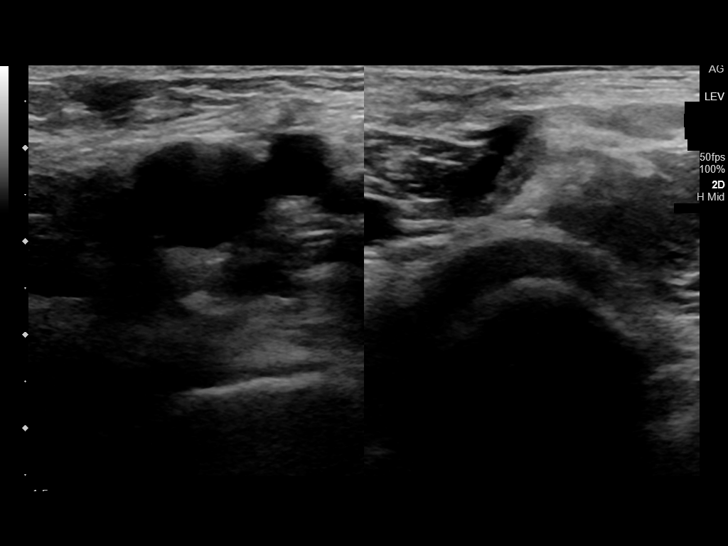
[im 12/44]
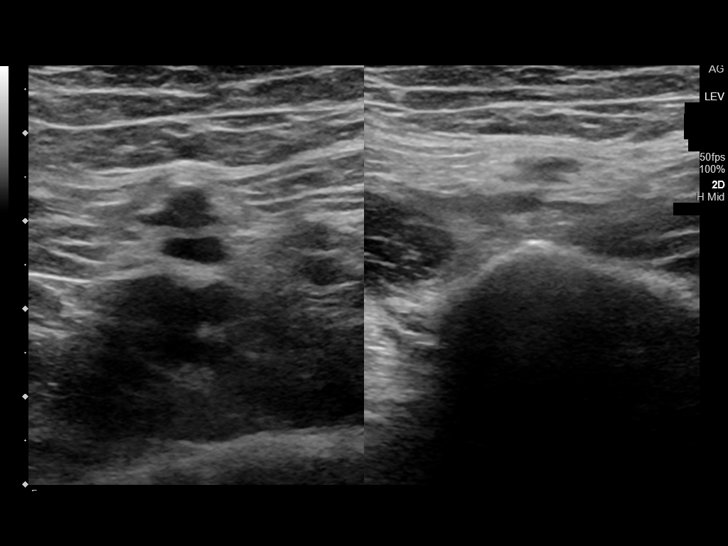
[im 14/44]
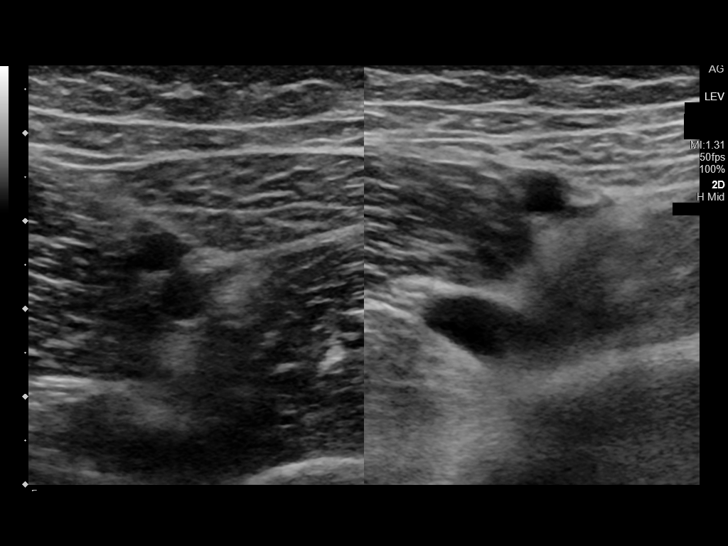
[im 17/44]
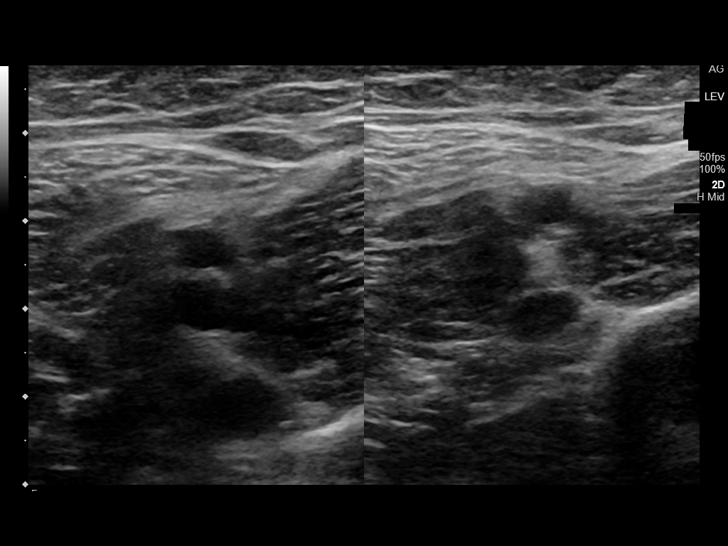
[im 21/44]
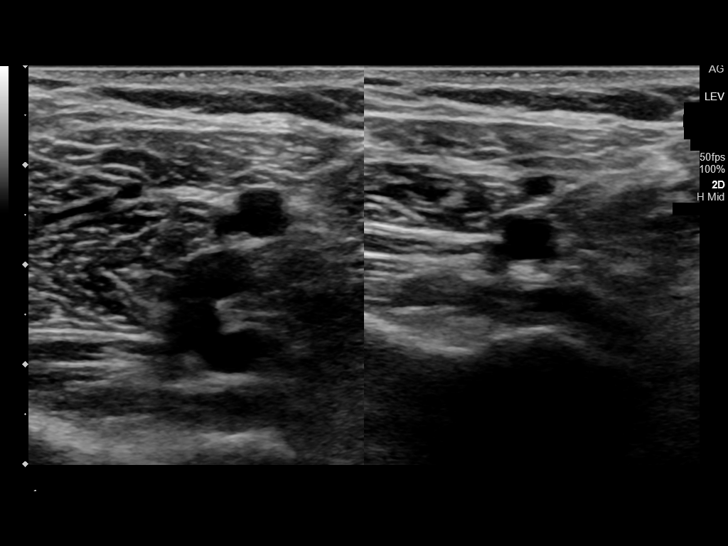
[im 23/44]
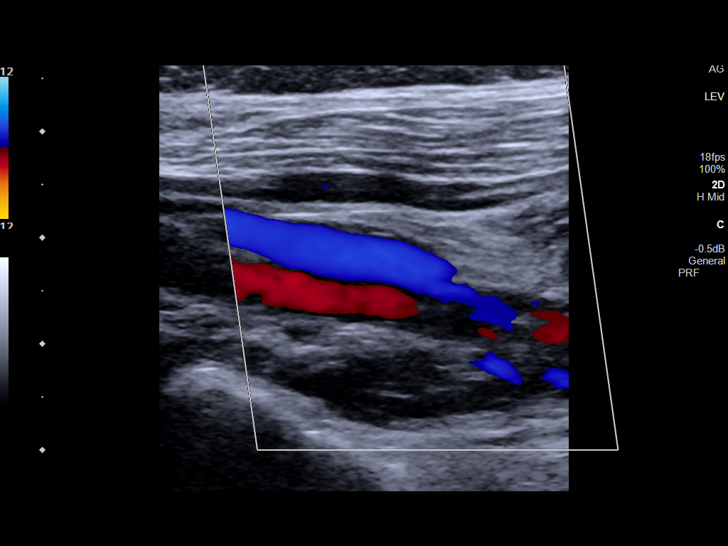
[im 27/44]
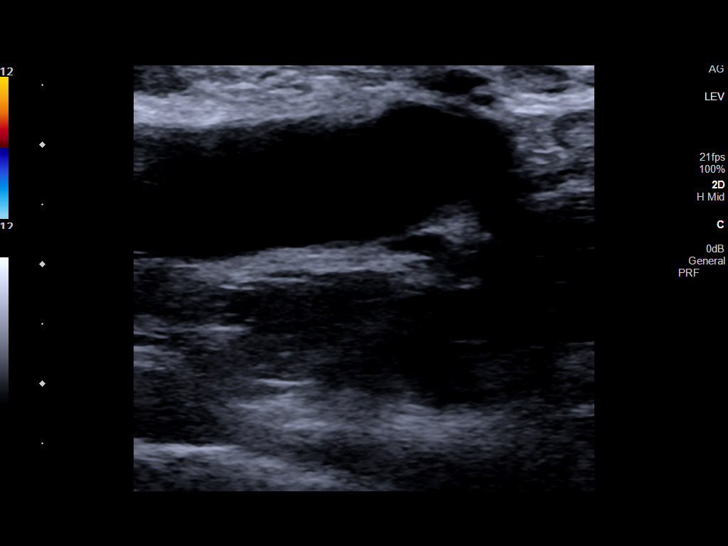
[im 30/44]
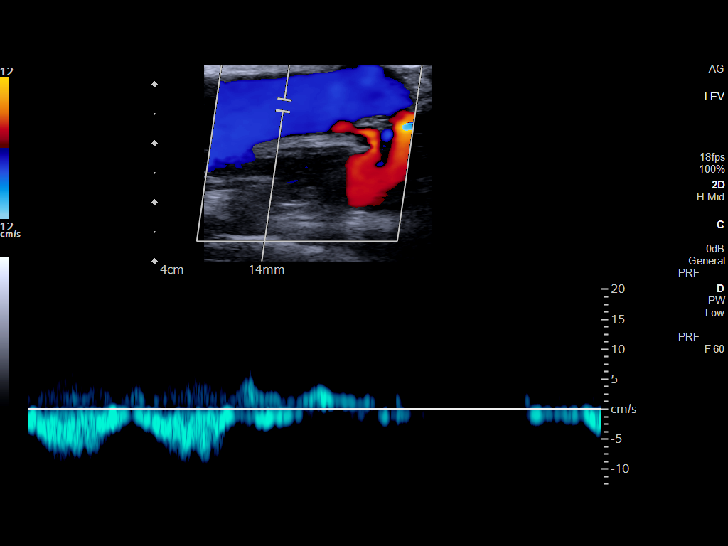
[im 34/44]
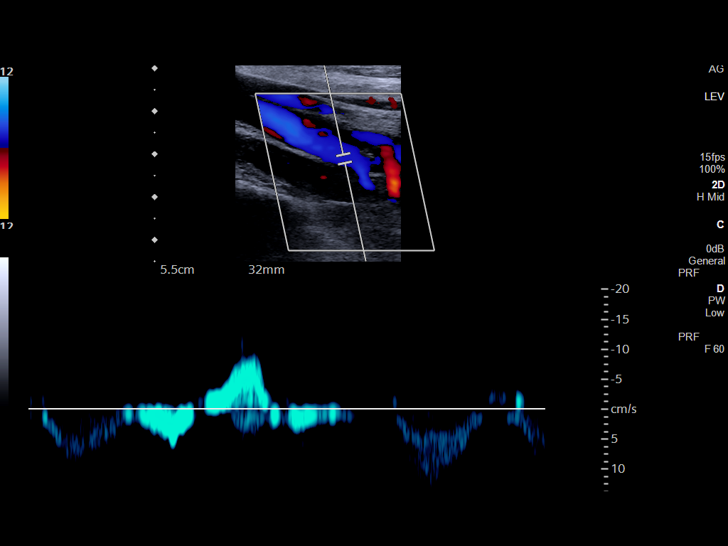
[im 36/44]
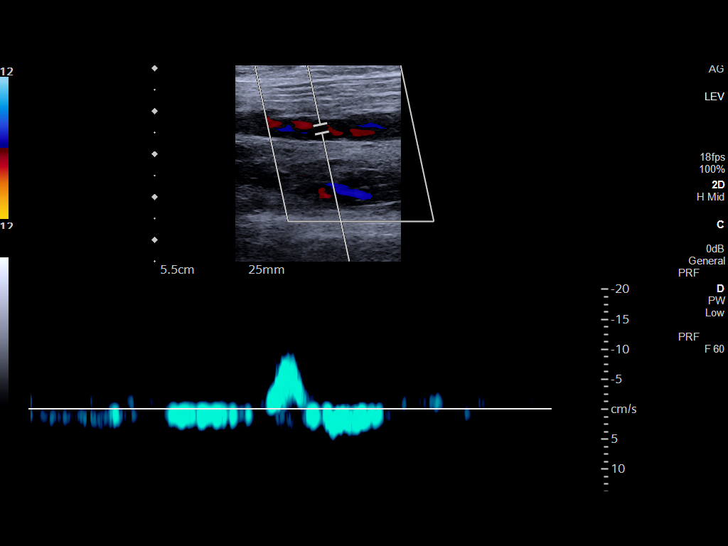
[im 40/44]
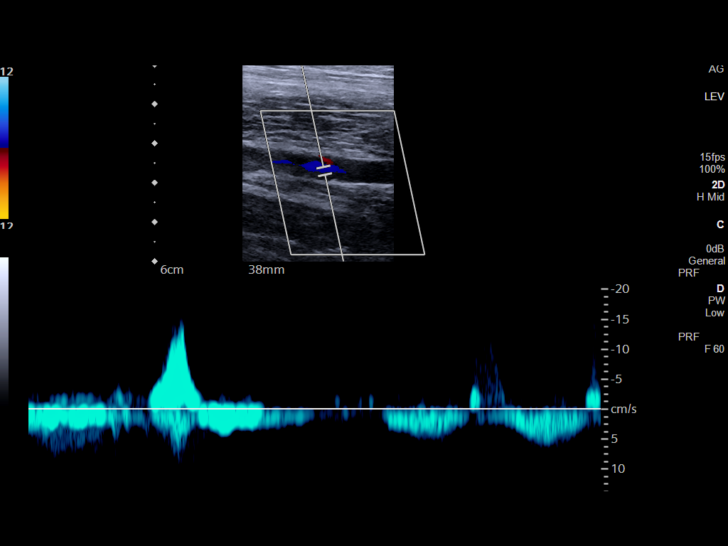
[im 44/44]
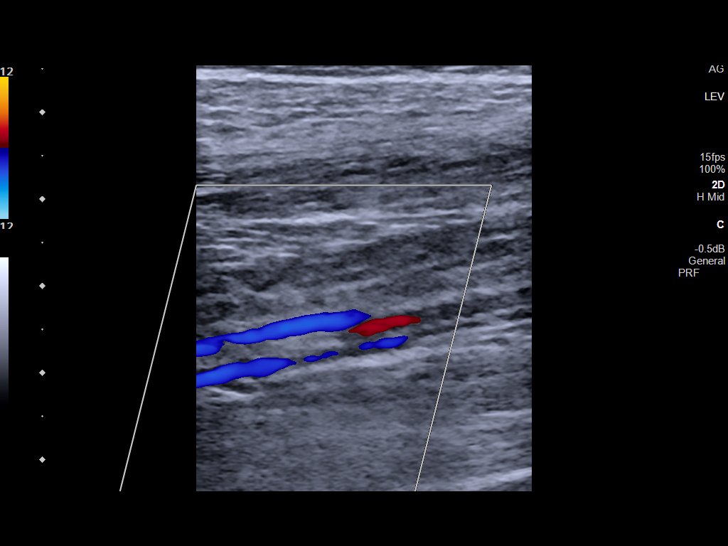

[14 of 24 positions shown; findings below may reference images not displayed]

FINDINGS: VENOUS

Normal compressibility of the common femoral, superficial femoral,
and popliteal veins, as well as the visualized calf veins.
Visualized portions of profunda femoral vein and great saphenous
vein unremarkable. No filling defects to suggest DVT on grayscale or
color Doppler imaging. Doppler waveforms show normal direction of
venous flow, normal respiratory phasicity and response to
augmentation.

Limited views of the contralateral common femoral vein are
unremarkable.

OTHER

None.

Limitations: none
IMPRESSION: No femoropopliteal DVT nor evidence of DVT within the visualized
calf veins.

If clinical symptoms are inconsistent or if there are persistent or
worsening symptoms, further imaging (possibly involving the iliac
veins) may be warranted.

## 2021-07-27 IMAGING — DX DG KNEE COMPLETE 4+V*L*
4 series · 4 of 4 positions shown · non-contrast
Comparison: None.

CLINICAL DATA: Left knee pain.

EXAM:
LEFT KNEE - COMPLETE 4+ VIEW

[knee lat]
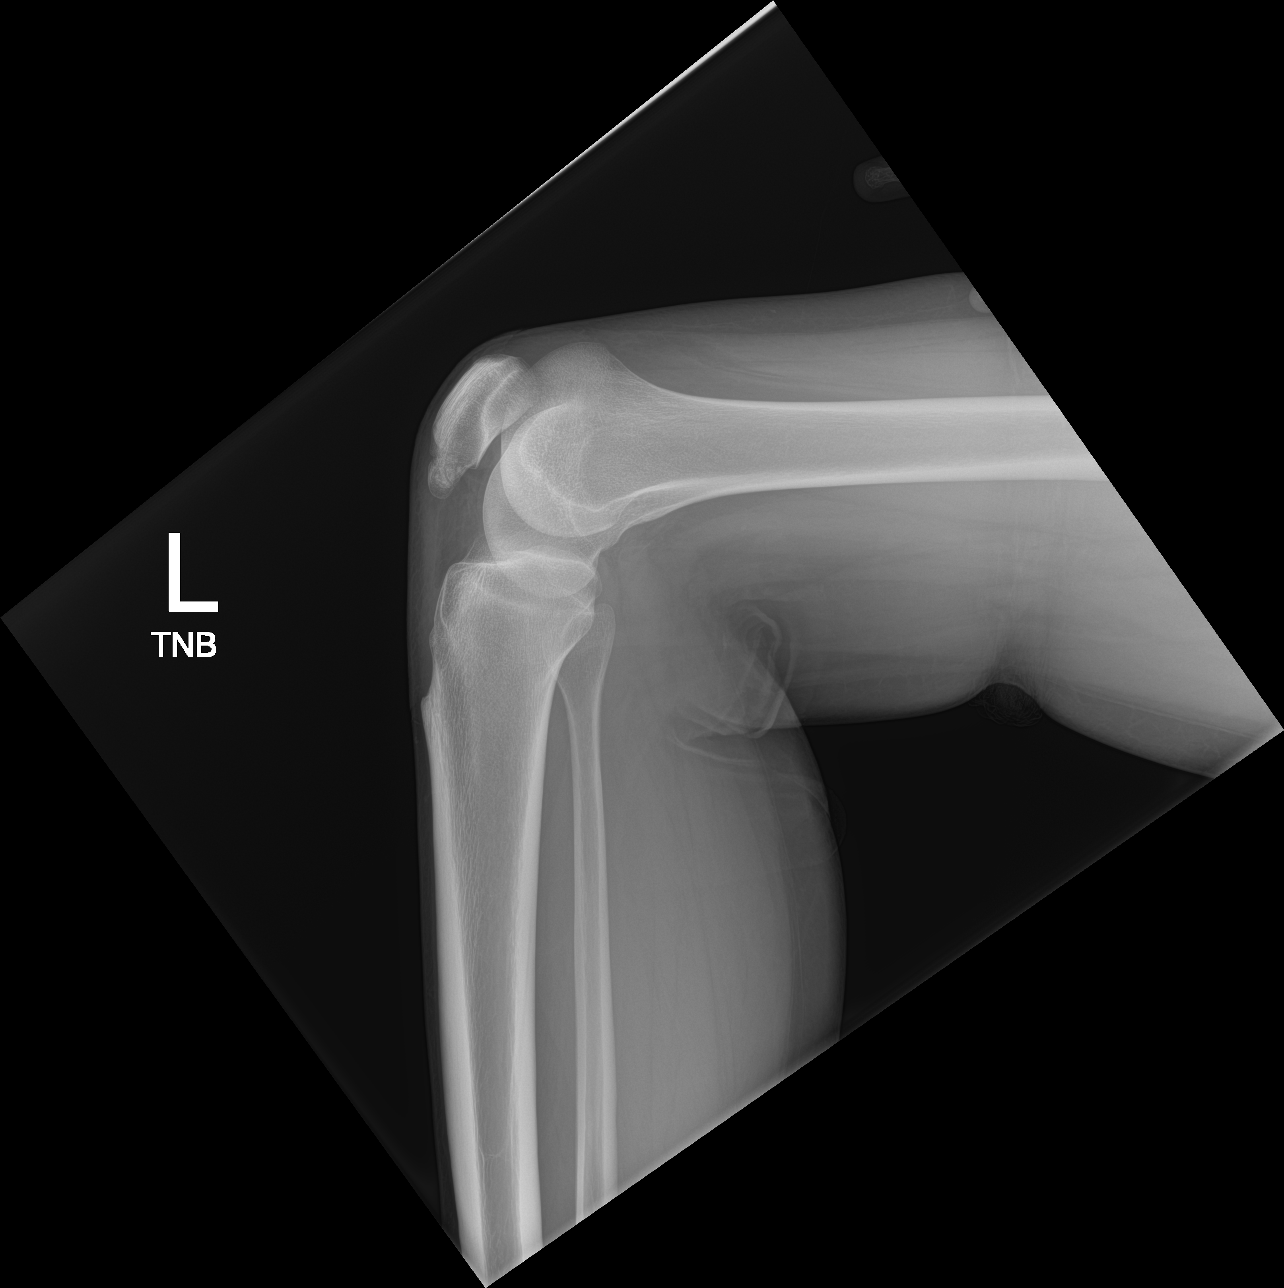

[knee obl (1 of 2)]
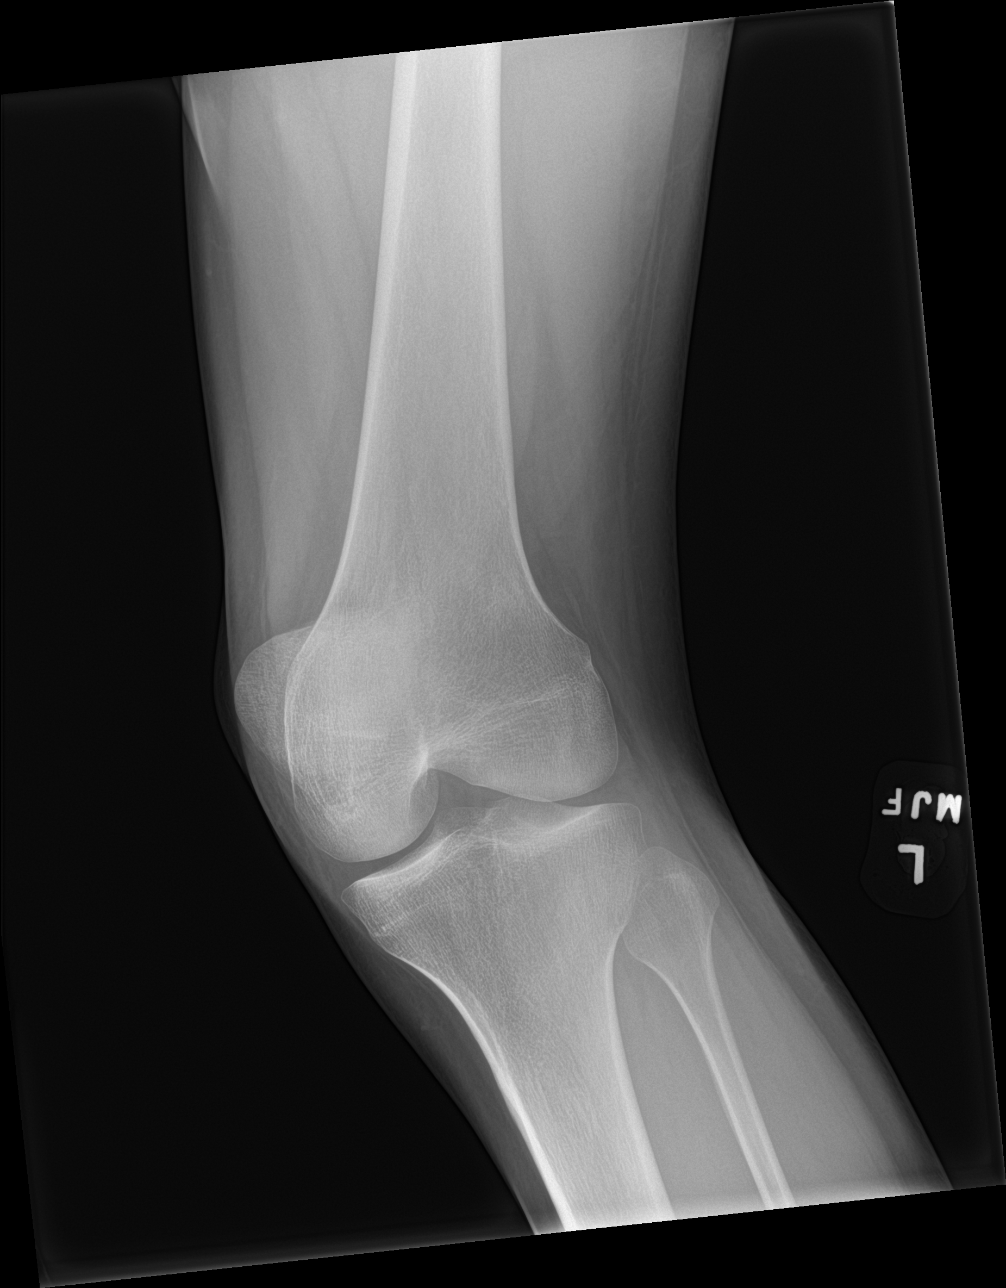

[knee obl (2 of 2)]
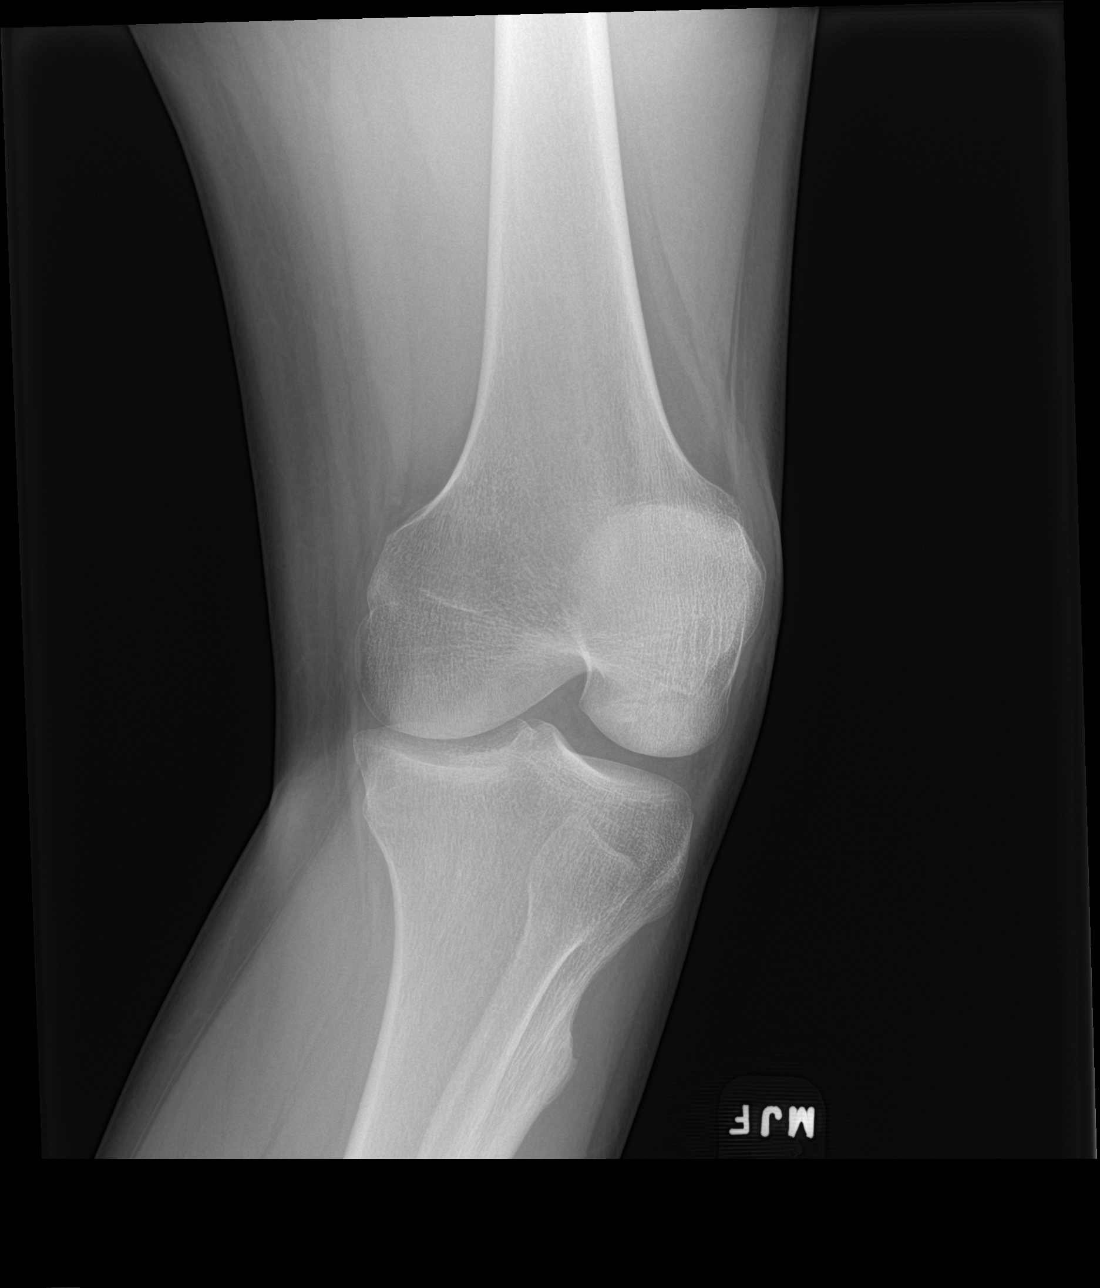

[knee ap]
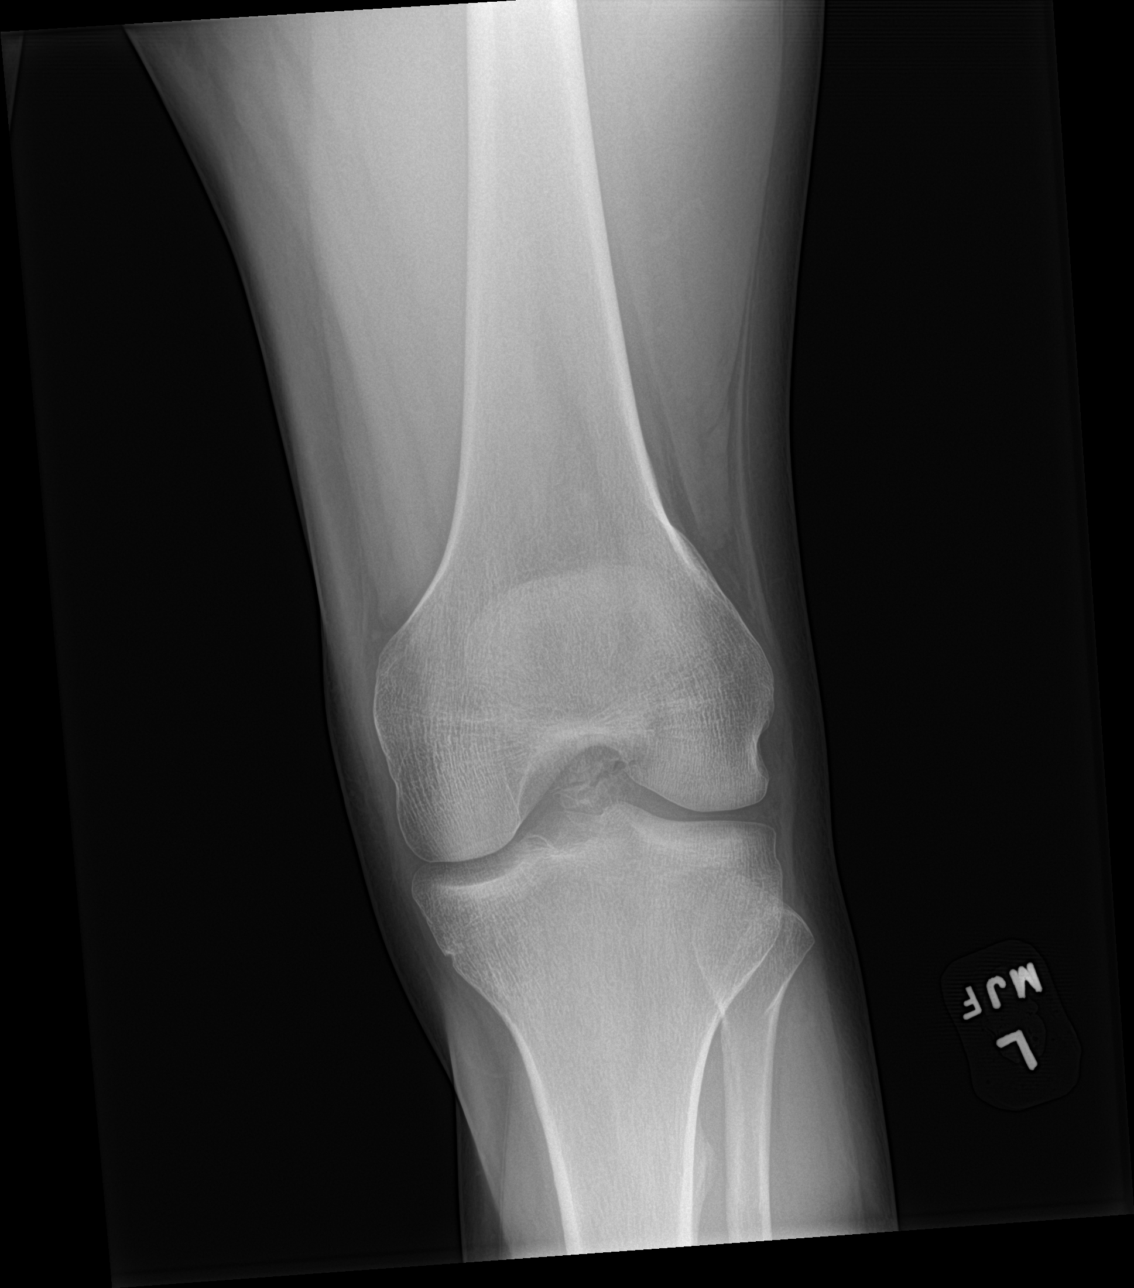

[4 of 4 positions shown; findings below may reference images not displayed]

FINDINGS: The joint spaces are maintained. No acute fracture is identified. No
osteochondral lesion. No joint effusion.

Prominent unfused secondary ossification center involving inferior
pole of the patella (type 1 bipartite patella).
IMPRESSION: 1. No acute bony findings or significant degenerative changes.
2. Type 1 Bipartite patella.

## 2022-02-06 NOTE — Progress Notes (Deleted)
I,Jana Meshell Abdulaziz,acting as a Education administrator for [employer], PA-C.,have documented all relevant documentation on the behalf of Mardene Speak, PA-C,as directed by  [employer], PA-C while in the presence of [employer], PA-C.  New patient visit   Patient: Courtney Gordon   DOB: 02-13-1991   31 y.o. Female  MRN: [id number] Visit Date: 02/07/2022  Today's healthcare provider: Mardene Speak, PA-C   No chief complaint on file.  Subjective    Courtney Gordon is a 31 y.o. female who presents today as a new patient to establish care.  HPI  ***  Past Medical History:  Diagnosis Date   Anemia    Heart murmur    s/p ECHO during this pregnancy with EF >55%   Past Surgical History:  Procedure Laterality Date   CESAREAN SECTION N/A 02/28/2016   Procedure: CESAREAN SECTION;  Surgeon: Will Bonnet, MD;  Location: [facility] ORS;  Service: Obstetrics;  Laterality: N/A;   TONSILLECTOMY     Family Status  Relation Name Status   MGM  (Not Specified)   Family History  Problem Relation Age of Onset   Hypertension Maternal Grandmother    Social History   Socioeconomic History   Marital status: Single    Spouse name: Not on file   Number of children: Not on file   Years of education: Not on file   Highest education level: Not on file  Occupational History   Not on file  Tobacco Use   Smoking status: Every Day    Packs/day: 0.00    Types: Cigars, Cigarettes   Smokeless tobacco: Never  Substance and Sexual Activity   Alcohol use: Yes    Comment: occas   Drug use: Yes    Types: Marijuana   Sexual activity: Yes  Other Topics Concern   Not on file  Social History Narrative   Not on file   Social Determinants of Health   Financial Resource Strain: Not on file  Food Insecurity: Not on file  Transportation Needs: Not on file  Physical Activity: Not on file  Stress: Not on file  Social Connections: Not on file   Outpatient Medications Prior to Visit  Medication Sig    Levonorgest-Eth Est & Eth Est (QUARTETTE) 42-21-21-7 DAYS TABS Take 1 each by mouth daily.   predniSONE (DELTASONE) 10 MG tablet Take 1 tablet (10 mg total) by mouth daily. Day 1-3: take 4 tablets PO daily Day 4-6: take 3 tablets PO daily Day 7-9: take 2 tablets PO daily Day 10-12: take 1 tablet PO daily   No facility-administered medications prior to visit.   No Known Allergies  Immunization History  Administered Date(s) Administered   Tdap 01/18/2016    Health Maintenance  Topic Date Due   COVID-19 Vaccine (1) Never done   Hepatitis C Screening  Never done   INFLUENZA VACCINE  04/03/2022   PAP SMEAR-Modifier  12/18/2022   TETANUS/TDAP  01/17/2026   HIV Screening  Completed   HPV VACCINES  Aged Out    Patient Care Team: [facility], [facility] as PCP - General (General Practice)  Review of Systems  {Labs  Heme  Chem  Endocrine  Serology  Results Review (optional):23779}   Objective    There were no vitals taken for this visit. {Show previous vital signs (optional):23777}  Physical Exam ***  Depression Screen     View : No data to display.         No results found  for any visits on 02/07/22.  Assessment & Plan     ***  No follow-ups on file.     {provider attestation***:1}   Mardene Speak, Hershal Coria  [facility] [phone] (phone) [phone] (fax)  [place]

## 2022-02-07 ENCOUNTER — Ambulatory Visit: Payer: Self-pay | Admitting: Physician Assistant

## 2022-02-07 DIAGNOSIS — Z7689 Persons encountering health services in other specified circumstances: Secondary | ICD-10-CM

## 2022-09-24 ENCOUNTER — Encounter: Payer: Self-pay | Admitting: Advanced Practice Midwife

## 2022-09-24 ENCOUNTER — Ambulatory Visit (INDEPENDENT_AMBULATORY_CARE_PROVIDER_SITE_OTHER): Payer: Medicaid Other | Admitting: Advanced Practice Midwife

## 2022-09-24 VITALS — BP 102/70 | HR 85 | Resp 16 | Wt 128.2 lb

## 2022-09-24 DIAGNOSIS — N939 Abnormal uterine and vaginal bleeding, unspecified: Secondary | ICD-10-CM | POA: Diagnosis not present

## 2022-09-24 DIAGNOSIS — Z30011 Encounter for initial prescription of contraceptive pills: Secondary | ICD-10-CM | POA: Diagnosis not present

## 2022-09-24 DIAGNOSIS — Z3202 Encounter for pregnancy test, result negative: Secondary | ICD-10-CM | POA: Diagnosis not present

## 2022-09-24 LAB — POCT URINE PREGNANCY: Preg Test, Ur: NEGATIVE

## 2022-09-24 MED ORDER — NORETHINDRONE 0.35 MG PO TABS: 1.0000 | ORAL_TABLET | Freq: Every day | ORAL | 11 refills | Status: DC

## 2022-09-24 NOTE — Patient Instructions (Signed)
Abnormal Uterine Bleeding Abnormal uterine bleeding means bleeding more than normal from your womb (uterus). It can include: Bleeding after sex. Bleeding between monthly (menstrual) periods. Bleeding that is heavier than normal. Monthly periods that last longer than normal. Bleeding after you have stopped having your monthly period (menopause). You should see a doctor for any kind of bleeding that is not normal. Treatment depends on the cause of your bleeding and how much you bleed. Follow these instructions at home: Medicines Take over-the-counter and prescription medicines only as told by your doctor. Ask your doctor about: Taking medicines such as aspirin and ibuprofen. Do not take these medicines unless your doctor tells you to take them. Taking over-the-counter medicines, vitamins, herbs, and supplements. You may be given iron pills. Take them as told by your doctor. Managing constipation If you take iron pills, you may need to take these actions to prevent or treat trouble pooping (constipation): Drink enough fluid to keep your pee (urine) pale yellow. Take over-the-counter or prescription medicines. Eat foods that are high in fiber. These include beans, whole grains, and fresh fruits and vegetables. Limit foods that are high in fat and sugar. These include fried or sweet foods. Activity Change your activity to decrease bleeding if you need to change your sanitary pad more than one time every 2 hours: Lie in bed with your feet raised (elevated). Place a cold pack on your lower belly. Rest as much as you are able until the bleeding stops or slows down. General instructions Do not use tampons, douche, or have sex until your doctor says these things are okay. Change your pads often. Get regular exams. These include: Pelvic exams. Screenings for cancer of the cervix. It is up to you to get the results of any tests that are done. Ask how to get your results when they are  ready. Watch for any changes in your bleeding. For 2 months, write down: When your monthly period starts. When your monthly period ends. When you get any abnormal bleeding from your vagina. What problems you notice. Keep all follow-up visits. Contact a doctor if: The bleeding lasts more than one week. You feel dizzy at times. You feel like you may vomit (nausea). You vomit. You feel light-headed or weak. Your symptoms get worse. Get help right away if: You faint. You have to change pads every hour. You have pain in your belly. You have a fever or chills. You get sweaty or weak. You pass large blood clots from your vagina. These symptoms may be an emergency. Get help right away. Call your local emergency services (911 in the [place] Do not wait to see if the symptoms will go away. Do not drive yourself to the hospital. Summary Abnormal uterine bleeding means bleeding more than normal from your womb (uterus). Any kind of bleeding that is not normal should be checked by a doctor. Treatment depends on the cause of your bleeding and how much you bleed. Get help right away if you faint, you have to change pads every hour, or you pass large blood clots from your vagina. This information is not intended to replace advice given to you by your health care provider. Make sure you discuss any questions you have with your health care provider. Document Revised: 12/20/2020 Document Reviewed: 12/20/2020 Elsevier Patient Education  2023 Elsevier Inc.  

## 2022-09-24 NOTE — Progress Notes (Signed)
Patient ID: [name], female   DOB: September 21, 1990, 32 y.o.   MRN: [id number]  Reason for Consult: Contraception (Patient would like to discuss contraception options. )   Referred by [facility], [facility]  Subjective:  HPI:  NAESHA BUCKALEW is a 32 y.o. female being seen to discuss irregular and heavy menstrual bleeding. She would like to take hormonal cycle control. Prior to her 2017 pregnancy she had heavy periods with clots. Since that pregnancy her periods are irregular- every 2-4 weeks- and lighter. She is chronically anemic. She does take iron and vitamin D. She wonders if she may have had a recent miscarriage due to the way the blood looked and the length of the bleeding. She has negative UPT today.   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 She did not have follow up at the time. I recommended she schedule an annual visit with PAP at her soonest convenience. She mentions a family history of fibroids and wonders if her heavy bleeding with clots and chronic anemia are due to fibroids. She requests an ultrasound for that purpose and to check on possible recent miscarriage. She currently uses condoms or abstinence for birth control.  Past Medical History:  Diagnosis Date   Anemia    Heart murmur    s/p ECHO during this pregnancy with EF >55%   Family History  Problem Relation Age of Onset   Hypertension Maternal Grandmother    Past Surgical History:  Procedure Laterality Date   CESAREAN SECTION N/A 02/28/2016   Procedure: CESAREAN SECTION;  Surgeon: Will Bonnet, MD;  Location: [facility] ORS;  Service: Obstetrics;  Laterality: N/A;   TONSILLECTOMY      Short Social History:  Social History   Tobacco Use   Smoking status: Every Day    Packs/day: 0.00    Types: Cigars, Cigarettes   Smokeless tobacco: Never  Substance Use Topics   Alcohol use: Yes    Comment: occas    No Known Allergies  Current Outpatient  Medications  Medication Sig Dispense Refill   norethindrone (MICRONOR) 0.35 MG tablet Take 1 tablet (0.35 mg total) by mouth daily. 30 tablet 11   No current facility-administered medications for this visit.   Review of Systems  Constitutional:  Positive for malaise/fatigue. Negative for chills and fever.  HENT:  Negative for congestion, ear discharge, ear pain, hearing loss, sinus pain and sore throat.   Eyes:  Negative for blurred vision and double vision.  Respiratory:  Negative for cough, shortness of breath and wheezing.   Cardiovascular:  Negative for chest pain, palpitations and leg swelling.  Gastrointestinal:  Positive for diarrhea. Negative for abdominal pain, blood in stool, constipation, heartburn, melena, nausea and vomiting.  Genitourinary:  Negative for dysuria, flank pain, frequency, hematuria and urgency.  Musculoskeletal:  Negative for back pain, joint pain and myalgias.  Skin:  Negative for itching and rash.  Neurological:  Negative for dizziness, tingling, tremors, sensory change, speech change, focal weakness, seizures, loss of consciousness, weakness and headaches.  Endo/Heme/Allergies:  Negative for environmental allergies. Does not bruise/bleed easily.       Positive for irregular and heavy periods  Psychiatric/Behavioral:  Negative for depression, hallucinations, memory loss, substance abuse and suicidal ideas. The patient is not nervous/anxious and does not have insomnia.         Objective:  Objective   Vitals:   09/24/22 1455  BP: 102/70  Pulse: 85  Resp: 16  Weight: 128 lb 3.2 oz (58.2 kg)   Body mass index is 21.33 kg/m. Constitutional: Well nourished, well developed female in no acute distress.  HEENT: normal Skin: Warm and dry.  Cardiovascular: Regular rate and rhythm.   Extremity:  no edema   Respiratory: Clear to auscultation bilateral. Normal respiratory effort Psych: Alert and Oriented x3. No memory deficits. Normal mood and affect.  MS:  normal gait, normal bilateral lower extremity ROM/strength/stability.   Data:  Latest Reference Range & Units 09/24/22 15:06  Preg Test, Ur Negative  Negative       Assessment/Plan:     32 y.o. G3 P94 female with abnormal uterine bleeding, chronic anemia  Rx progesterone only pills for cycle/birth control Gyn ultrasound with f/u after Schedule annual exam with PAP smear   [facility] 09/24/2022, 5:44 PM

## 2022-10-11 ENCOUNTER — Other Ambulatory Visit: Payer: Medicaid Other

## 2022-10-11 ENCOUNTER — Ambulatory Visit
Admission: RE | Admit: 2022-10-11 | Discharge: 2022-10-11 | Disposition: A | Discharge status: Home / Self Care | Payer: Medicaid Other | Source: Ambulatory Visit | Attending: Advanced Practice Midwife | Admitting: Advanced Practice Midwife

## 2022-10-11 DIAGNOSIS — N939 Abnormal uterine and vaginal bleeding, unspecified: Secondary | ICD-10-CM

## 2022-10-18 ENCOUNTER — Ambulatory Visit: Payer: Medicaid Other | Admitting: Advanced Practice Midwife

## 2022-10-29 ENCOUNTER — Other Ambulatory Visit (HOSPITAL_COMMUNITY)
Admission: RE | Admit: 2022-10-29 | Discharge: 2022-10-29 | Disposition: A | Discharge status: Home / Self Care | Payer: Medicaid Other | Source: Ambulatory Visit | Attending: Advanced Practice Midwife | Admitting: Advanced Practice Midwife

## 2022-10-29 ENCOUNTER — Ambulatory Visit (INDEPENDENT_AMBULATORY_CARE_PROVIDER_SITE_OTHER): Payer: Medicaid Other | Admitting: Obstetrics & Gynecology

## 2022-10-29 ENCOUNTER — Encounter: Payer: Self-pay | Admitting: Obstetrics & Gynecology

## 2022-10-29 VITALS — BP 108/55 | HR 79 | Ht 65.0 in | Wt 128.0 lb

## 2022-10-29 DIAGNOSIS — Z124 Encounter for screening for malignant neoplasm of cervix: Secondary | ICD-10-CM

## 2022-10-29 DIAGNOSIS — Z1151 Encounter for screening for human papillomavirus (HPV): Secondary | ICD-10-CM | POA: Diagnosis present

## 2022-10-29 DIAGNOSIS — N939 Abnormal uterine and vaginal bleeding, unspecified: Secondary | ICD-10-CM

## 2022-10-29 DIAGNOSIS — N938 Other specified abnormal uterine and vaginal bleeding: Secondary | ICD-10-CM | POA: Diagnosis present

## 2022-10-29 DIAGNOSIS — Z23 Encounter for immunization: Secondary | ICD-10-CM

## 2022-10-29 DIAGNOSIS — Z01419 Encounter for gynecological examination (general) (routine) without abnormal findings: Secondary | ICD-10-CM | POA: Insufficient documentation

## 2022-10-29 DIAGNOSIS — D219 Benign neoplasm of connective and other soft tissue, unspecified: Secondary | ICD-10-CM

## 2022-10-29 DIAGNOSIS — E559 Vitamin D deficiency, unspecified: Secondary | ICD-10-CM | POA: Insufficient documentation

## 2022-10-29 DIAGNOSIS — Z30011 Encounter for initial prescription of contraceptive pills: Secondary | ICD-10-CM

## 2022-10-29 DIAGNOSIS — R87610 Atypical squamous cells of undetermined significance on cytologic smear of cervix (ASC-US): Secondary | ICD-10-CM

## 2022-10-29 MED ORDER — NORETHINDRONE 0.35 MG PO TABS: 1.0000 | ORAL_TABLET | Freq: Every day | ORAL | 4 refills | Status: AC

## 2022-10-29 NOTE — Addendum Note (Signed)
Addended by: Emily Filbert on: 10/29/2022 03:36 PM   Modules accepted: Orders

## 2022-10-29 NOTE — Addendum Note (Signed)
Addended by: Quintella Baton D on: 10/29/2022 03:57 PM   Modules accepted: Orders

## 2022-10-29 NOTE — Progress Notes (Addendum)
Subjective:    Courtney Gordon is a 32 y.o. single P25 (32 yo) who presents for an annual exam. She reports normal periods until the birth of her daughter 7 years ago. Since then she reports that her periods are completely irregular. She will bleed every few weeks for a few days, nothing very heavy. She was noted to have 3 small fibroids on an ultrasound done 10/12/2022 and was prescribed norethindrone 0.35 mg daily. She lost her pill pack and started with light bleeding again since stopping the pills. She uses condoms for contraception. She has been with her current partner for about 5 months. The patient is sexually active. GYN screening history: last pap: was abnormal: ASCUS + HR HPV 16 in 2021 . The patient wears seatbelts: yes. The patient participates in regular exercise: yes. Has the patient ever been transfused or tattooed?: yes. The patient reports that there is not domestic violence in her life.  She has been anemic "since I was a kid". She says that she does not have sickle cell disease. She has seen a hematologist, has had oral and IV iron.   She says that she does not want more kids but declines a bilateral salpingectomy.   Menstrual History: OB History     Gravida  3   Para  2   Term  1   Preterm  1   AB  1   Living  2      SAB  1   IAB      Ectopic      Multiple  0   Live Births  2          No LMP recorded.    The following portions of the patient's history were reviewed and updated as appropriate: allergies, current medications, past family history, past medical history, past social history, past surgical history, and problem list.  Review of Systems Pertinent items are noted in HPI.   She had trich in 2021  Objective:    BP (!) 108/55   Pulse 79   Ht '5\' 5"'$  (1.651 m)   Wt 128 lb (58.1 kg)   BMI 21.30 kg/m   General Appearance:    Alert, cooperative, no distress, appears stated age  Head:    Normocephalic, without obvious abnormality, atraumatic   Eyes:    PERRL, conjunctiva/corneas clear, EOM's intact, fundi    benign, both eyes  Ears:    Normal TM's and external ear canals, both ears  Nose:   Nares normal, septum midline, mucosa normal, no drainage    or sinus tenderness  Throat:   Lips, mucosa, and tongue normal; teeth and gums normal  Neck:   Supple, symmetrical, trachea midline, no adenopathy;    thyroid:  no enlargement/tenderness/nodules; no carotid   bruit or JVD  Back:     Symmetric, no curvature, ROM normal, no CVA tenderness  Lungs:     Clear to auscultation bilaterally, respirations unlabored  Chest Wall:    No tenderness or deformity   Heart:    Regular rate and rhythm, S1 and S2 normal, no murmur, rub   or gallop  Breast Exam:    No tenderness, masses, or nipple abnormality  Abdomen:     Soft, non-tender, bowel sounds active all four quadrants,    no masses, no organomegaly  Genitalia:    Normal female without lesion, discharge or tenderness, normal size and shape, anteverted uterus, normal adnexal exam, mobile, small amount of blood seen at cervical  os      Extremities:   Extremities normal, atraumatic, no cyanosis or edema  Pulses:   2+ and symmetric all extremities  Skin:   Skin color, texture, turgor normal, no rashes or lesions  Lymph nodes:   Cervical, supraclavicular, and axillary nodes normal  Neurologic:   CNII-XII intact, normal strength, sensation and reflexes    throughout  .    Assessment:    Healthy female exam.  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

## 2022-10-30 ENCOUNTER — Encounter: Payer: Self-pay | Admitting: Obstetrics & Gynecology

## 2022-10-30 ENCOUNTER — Other Ambulatory Visit: Payer: Self-pay | Admitting: Obstetrics & Gynecology

## 2022-10-30 LAB — VITAMIN D 25 HYDROXY (VIT D DEFICIENCY, FRACTURES): Vit D, 25-Hydroxy: 16.3 ng/mL — ABNORMAL LOW (ref 30.0–100.0)

## 2022-10-30 MED ORDER — VITAMIN D (ERGOCALCIFEROL) 1.25 MG (50000 UNIT) PO CAPS: 50000.0000 [IU] | ORAL_CAPSULE | ORAL | 0 refills | Status: AC

## 2022-10-31 LAB — CERVICOVAGINAL ANCILLARY ONLY
Bacterial Vaginitis (gardnerella): POSITIVE — AB
Candida Glabrata: NEGATIVE
Candida Vaginitis: NEGATIVE
Comment: NEGATIVE
Comment: NEGATIVE
Comment: NEGATIVE
Comment: NEGATIVE
Trichomonas: NEGATIVE

## 2022-11-01 ENCOUNTER — Encounter: Payer: Self-pay | Admitting: Obstetrics & Gynecology

## 2022-11-01 ENCOUNTER — Other Ambulatory Visit: Payer: Self-pay | Admitting: Obstetrics & Gynecology

## 2022-11-01 DIAGNOSIS — N760 Acute vaginitis: Secondary | ICD-10-CM

## 2022-11-01 LAB — CYTOLOGY - PAP
Chlamydia: NEGATIVE
Comment: NEGATIVE
Comment: NEGATIVE
Comment: NORMAL
Diagnosis: UNDETERMINED — AB
High risk HPV: POSITIVE — AB
Neisseria Gonorrhea: NEGATIVE

## 2022-11-01 MED ORDER — METRONIDAZOLE 500 MG PO TABS: 500.0000 mg | ORAL_TABLET | Freq: Two times a day (BID) | ORAL | 0 refills | Status: AC

## 2022-11-01 NOTE — Progress Notes (Signed)
Flagyl prescribed for BV seen on Aptima

## 2022-11-06 ENCOUNTER — Encounter: Payer: Self-pay | Admitting: Obstetrics & Gynecology

## 2022-11-06 ENCOUNTER — Telehealth: Payer: Self-pay | Admitting: Obstetrics & Gynecology

## 2022-11-06 NOTE — Telephone Encounter (Signed)
-----   Message from Emily Filbert, MD sent at 11/06/2022  8:42 AM EST ----- She needs a colpo, I sent her a message. Thanks

## 2022-11-06 NOTE — Telephone Encounter (Signed)
I contacted the patient via phone. I was unable to leave message due to voicemail not set up.

## 2022-11-12 ENCOUNTER — Encounter: Payer: Self-pay | Admitting: Obstetrics & Gynecology

## 2022-11-12 NOTE — Telephone Encounter (Signed)
Letter mailed

## 2022-12-28 ENCOUNTER — Ambulatory Visit (INDEPENDENT_AMBULATORY_CARE_PROVIDER_SITE_OTHER): Payer: Medicaid Other

## 2022-12-28 VITALS — BP 99/62 | HR 76 | Ht 66.0 in | Wt 132.4 lb

## 2022-12-28 DIAGNOSIS — Z23 Encounter for immunization: Secondary | ICD-10-CM

## 2022-12-28 NOTE — Progress Notes (Signed)
    NURSE VISIT NOTE  Subjective:    Patient ID: Courtney Gordon, female    DOB: [birth year], 32 y.o.   MRN: [id number]  HPI  Patient is a 32 y.o. [id number] female Single African American female who presents for her second Gardasil injection. Order to administer given by Nicholaus Bloom, MD.   Objective:    BP 99/62   Pulse 76   Ht 5\' 6"  (1.676 m)   Wt 132 lb 6.4 oz (60.1 kg)   LMP 12/22/2022   BMI 21.37 kg/m   32 y.o. LMP:  11/21/22   Contraception:   OCP Given by: Georgiana Shore, CMA Site:  left deltoid    Assessment:   1. Need for HPV vaccine      Plan:   Patient will return in 4 months for third injection.    Loman Chroman, CMA

## 2022-12-31 ENCOUNTER — Ambulatory Visit: Payer: Medicaid Other | Admitting: Obstetrics & Gynecology

## 2023-04-29 ENCOUNTER — Ambulatory Visit: Payer: Medicaid Other

## 2023-11-26 ENCOUNTER — Ambulatory Visit
Admission: EM | Admit: 2023-11-26 | Discharge: 2023-11-26 | Disposition: A | Discharge status: Home / Self Care | Attending: Physician Assistant | Admitting: Physician Assistant

## 2023-11-26 DIAGNOSIS — R112 Nausea with vomiting, unspecified: Secondary | ICD-10-CM | POA: Diagnosis present

## 2023-11-26 DIAGNOSIS — Z3202 Encounter for pregnancy test, result negative: Secondary | ICD-10-CM | POA: Diagnosis not present

## 2023-11-26 LAB — PREGNANCY, URINE: Preg Test, Ur: NEGATIVE

## 2023-11-26 NOTE — ED Triage Notes (Signed)
 Pt c/o abd pain & N/V x3 days. States able to keep foods & fluids down.

## 2023-11-26 NOTE — Discharge Instructions (Addendum)
-  Pregnancy test was negative. - Take another pregnancy test in a week if you still concerns. - Increase rest and fluids. - Go to emergency department if fever, severe abdominal pain, heavy vaginal bleeding, weakness.

## 2023-11-26 NOTE — ED Provider Notes (Signed)
 [facility]    CSN: [id number] Arrival date & time: 11/26/23  1236      History   Chief Complaint Chief Complaint  Patient presents with   Emesis   Nausea   Abdominal Pain    HPI Courtney Gordon is a 33 y.o. female presenting for a pregnancy test.  Patient reports on Saturday and Sunday she had vaginal bleeding.  This was around the time her normal menstrual period is but it was shorter than normal.  Reports associated nausea, vomiting and pelvic cramping at that time.  Feels well at this time but wants to see if she could possibly be pregnant.  She does not use any birth control.  Denies fever, fatigue, cough and congestion, sore throat, chest pain, shortness of breath, abdominal pain, diarrhea, constipation, dysuria, frequency, urgency, vaginal discharge or concern for STIs.  HPI  Past Medical History:  Diagnosis Date   Anemia    Heart murmur    s/p ECHO during this pregnancy with EF >55%    Patient Active Problem List   Diagnosis Date Noted   Vitamin D deficiency 10/29/2022   Anemia 10/17/2015    Past Surgical History:  Procedure Laterality Date   CESAREAN SECTION N/A 02/28/2016   Procedure: CESAREAN SECTION;  Surgeon: Conard Novak, MD;  Location: [facility] ORS;  Service: Obstetrics;  Laterality: N/A;   TONSILLECTOMY      OB History     Gravida  3   Para  2   Term  1   Preterm  1   AB  1   Living  2      SAB  1   IAB      Ectopic      Multiple  0   Live Births  2            Home Medications    Prior to Admission medications   Medication Sig Start Date End Date Taking? Authorizing Provider  ferrous sulfate 325 (65 FE) MG tablet Take 325 mg by mouth 2 (two) times daily. 09/26/22  Yes [provider]  metroNIDAZOLE (FLAGYL) 500 MG tablet Take 1 tablet (500 mg total) by mouth 2 (two) times daily. 11/01/22   Allie Bossier, MD  Vitamin D, Ergocalciferol, (DRISDOL) 1.25 MG (50000 UNIT) CAPS capsule Take 50,000 Units  by mouth once a week. 09/26/22  Yes [provider]  Vitamin D, Ergocalciferol, (DRISDOL) 1.25 MG (50000 UNIT) CAPS capsule Take 1 capsule (50,000 Units total) by mouth every 7 (seven) days. 10/30/22   Allie Bossier, MD  norethindrone (MICRONOR) 0.35 MG tablet Take 1 tablet (0.35 mg total) by mouth daily. 10/29/22   Allie Bossier, MD    Family History Family History  Problem Relation Age of Onset   Hypertension Maternal Grandmother     Social History Social History   Tobacco Use   Smoking status: Every Day    Types: Cigars, Cigarettes   Smokeless tobacco: Never  Substance Use Topics   Alcohol use: Yes    Comment: occas   Drug use: Yes    Types: Marijuana     Allergies   Patient has no known allergies.   Review of Systems Review of Systems  Constitutional:  Negative for chills, diaphoresis, fatigue and fever.  HENT:  Negative for congestion and sore throat.   Respiratory:  Negative for cough and shortness of breath.   Cardiovascular:  Negative for chest pain.  Gastrointestinal:  Positive for abdominal  pain, nausea and vomiting.  Genitourinary:  Negative for difficulty urinating, dysuria, frequency and vaginal discharge.  Musculoskeletal:  Negative for arthralgias and myalgias.  Skin:  Negative for rash.  Neurological:  Negative for weakness and headaches.     Physical Exam Triage Vital Signs ED Triage Vitals  Encounter Vitals Group     BP 11/26/23 1249 98/64     Systolic BP Percentile --      Diastolic BP Percentile --      Pulse Rate 11/26/23 1249 73     Resp 11/26/23 1249 16     Temp 11/26/23 1249 99.1 F (37.3 C)     Temp Source 11/26/23 1249 Oral     SpO2 11/26/23 1249 99 %     Weight 11/26/23 1249 125 lb (56.7 kg)     Height 11/26/23 1249 5\' 6"  (1.676 m)     Head Circumference --      Peak Flow --      Pain Score 11/26/23 1302 0     Pain Loc --      Pain Education --      Exclude from Growth Chart --    No data found.  Updated Vital  Signs BP 98/64 (BP Location: Left Arm)   Pulse 73   Temp 99.1 F (37.3 C) (Oral)   Resp 16   Ht 5\' 6"  (1.676 m)   Wt 125 lb (56.7 kg)   LMP 10/26/2023 (Exact Date)   SpO2 99%   BMI 20.18 kg/m      Physical Exam Vitals and nursing note reviewed.  Constitutional:      General: She is not in acute distress.    Appearance: Normal appearance. She is not ill-appearing or toxic-appearing.  HENT:     Head: Normocephalic and atraumatic.     Nose: Nose normal.     Mouth/Throat:     Mouth: Mucous membranes are moist.     Pharynx: Oropharynx is clear.  Eyes:     General: No scleral icterus.       Right eye: No discharge.        Left eye: No discharge.     Conjunctiva/sclera: Conjunctivae normal.  Cardiovascular:     Rate and Rhythm: Normal rate and regular rhythm.     Heart sounds: Normal heart sounds.  Pulmonary:     Effort: Pulmonary effort is normal. No respiratory distress.     Breath sounds: Normal breath sounds.  Abdominal:     Palpations: Abdomen is soft.     Tenderness: There is no abdominal tenderness.  Musculoskeletal:     Cervical back: Neck supple.  Skin:    General: Skin is dry.  Neurological:     General: No focal deficit present.     Mental Status: She is alert. Mental status is at baseline.     Motor: No weakness.     Gait: Gait normal.  Psychiatric:        Mood and Affect: Mood normal.        Behavior: Behavior normal.      UC Treatments / Results  Labs (all labs ordered are listed, but only abnormal results are displayed) Labs Reviewed  PREGNANCY, URINE    EKG   Radiology No results found.  Procedures Procedures (including critical care time)  Medications Ordered in UC Medications - No data to display  Initial Impression / Assessment and Plan / UC Course  I have reviewed the triage vital signs and the nursing notes.  Pertinent labs & imaging results that were available during my care of the patient were reviewed by me and considered  in my medical decision making (see chart for details).   33 year old female presents for pregnancy test.  Reports vaginal bleeding for approximately 24 hours over the weekend.  Had lower abdominal pain, nausea and vomiting for a couple of days this weekend but no symptoms today.  Vitals are stable and normal.  Patient overall well-appearing.  No acute distress.  On exam no abdominal tenderness.  Chest clear.  Heart regular rate and rhythm.  Urine pregnancy test performed. Negative. Reviewed results with patient. Advised another pregnancy test in a week or if misses period if still having concerns.   Supportive care. Advised rest and fluids Reviewed return and ED precautions.    Final Clinical Impressions(s) / UC Diagnoses   Final diagnoses:  Negative pregnancy test  Nausea and vomiting, unspecified vomiting type     Discharge Instructions      -Pregnancy test was negative. - Take another pregnancy test in a week if you still concerns. - Increase rest and fluids. - Go to emergency department if fever, severe abdominal pain, heavy vaginal bleeding, weakness.     ED Prescriptions   None    PDMP not reviewed this encounter.   Shirlee Latch, PA-C 11/26/23 [phone]
